# Patient Record
Sex: Female | Born: 1960 | Race: Black or African American | Hispanic: No | Marital: Single
Health system: Midwestern US, Academic
[De-identification: ages and names within clinical notes are randomized; demographics above are authoritative.]

---

## 2018-08-27 ENCOUNTER — Encounter: Admit: 2018-08-27 | Discharge: 2018-08-28

## 2018-08-28 ENCOUNTER — Inpatient Hospital Stay: Admit: 2018-08-28 | Discharge: 2018-08-28

## 2018-08-28 ENCOUNTER — Encounter: Admit: 2018-08-28 | Discharge: 2018-08-28

## 2018-08-28 DIAGNOSIS — E119 Type 2 diabetes mellitus without complications: Principal | ICD-10-CM

## 2018-08-28 DIAGNOSIS — K56609 Unspecified intestinal obstruction, unspecified as to partial versus complete obstruction: ICD-10-CM

## 2018-08-28 DIAGNOSIS — N189 Chronic kidney disease, unspecified: Secondary | ICD-10-CM

## 2018-08-28 DIAGNOSIS — I10 Essential (primary) hypertension: ICD-10-CM

## 2018-08-28 DIAGNOSIS — E785 Hyperlipidemia, unspecified: ICD-10-CM

## 2018-08-28 DIAGNOSIS — J449 Chronic obstructive pulmonary disease, unspecified: ICD-10-CM

## 2018-08-28 LAB — COMPREHENSIVE METABOLIC PANEL
Lab: 0.8 mg/dL (ref 0.3–1.2)
Lab: 12 mg/dL (ref 7–25)
Lab: 143 MMOL/L (ref 137–147)
Lab: 149 mg/dL — ABNORMAL HIGH (ref 70–100)
Lab: 2 mg/dL — ABNORMAL HIGH (ref 0.4–1.00)
Lab: 24 U/L (ref 7–40)
Lab: 3.2 MMOL/L — ABNORMAL LOW (ref 3.5–5.1)
Lab: 42 U/L (ref 25–110)
Lab: 6 g/dL (ref 6.0–8.0)
Lab: 8.2 mg/dL — ABNORMAL LOW (ref 8.5–10.6)

## 2018-08-28 LAB — LACTIC ACID(LACTATE): Lab: 1 MMOL/L — ABNORMAL LOW (ref >60)

## 2018-08-28 LAB — CBC
Lab: 12 10*3/uL — ABNORMAL HIGH (ref 4.5–11.0)
Lab: 13 g/dL (ref 12.0–15.0)
Lab: 39 % (ref >60)
Lab: 4.5 M/UL — ABNORMAL HIGH (ref 4.0–5.0)
Lab: 88 FL (ref >60)

## 2018-08-28 LAB — PREALBUMIN: Lab: 14 mg/dL — ABNORMAL LOW (ref 17–34)

## 2018-08-28 LAB — POC GLUCOSE: Lab: 99 mg/dL (ref 70–100)

## 2018-08-28 MED ORDER — FLU VACC QS2019-20 6MOS UP(PF) 60 MCG (15 MCG X 4)/0.5 ML IM SYRG
.5 mL | Freq: Once | INTRAMUSCULAR | 0 refills | Status: CP
Start: 2018-08-28 — End: ?
  Administered 2018-08-28: 16:00:00 0.5 mL via INTRAMUSCULAR

## 2018-08-28 MED ORDER — SUCCINYLCHOLINE CHLORIDE 20 MG/ML IJ SOLN
INTRAVENOUS | 0 refills | Status: DC
Start: 2018-08-28 — End: 2018-08-29
  Administered 2018-08-28: 21:00:00 200 mg via INTRAVENOUS

## 2018-08-28 MED ORDER — FENTANYL PCA 550 MCG/55 ML SYR (STD CONC)(ADULT)(PREMADE)
INTRAVENOUS | 0 refills | Status: DC
Start: 2018-08-28 — End: 2018-08-29
  Administered 2018-08-29: 02:00:00 55.000 mL via INTRAVENOUS

## 2018-08-28 MED ORDER — ROCURONIUM 10 MG/ML IV SOLN
INTRAVENOUS | 0 refills | Status: DC
Start: 2018-08-28 — End: 2018-08-29
  Administered 2018-08-28: 21:00:00 50 mg via INTRAVENOUS
  Administered 2018-08-28 – 2018-08-29 (×2): 10 mg via INTRAVENOUS

## 2018-08-28 MED ORDER — METOCLOPRAMIDE HCL 5 MG/ML IJ SOLN
10 mg | Freq: Once | INTRAVENOUS | 0 refills | Status: DC | PRN
Start: 2018-08-28 — End: 2018-08-29

## 2018-08-28 MED ORDER — SODIUM CHLORIDE 0.9 % IV SOLP
0 refills | Status: DC
Start: 2018-08-28 — End: 2018-08-29
  Administered 2018-08-28 – 2018-08-29 (×2): via INTRAVENOUS

## 2018-08-28 MED ORDER — ONDANSETRON HCL (PF) 4 MG/2 ML IJ SOLN
4-8 mg | INTRAVENOUS | 0 refills | Status: DC | PRN
Start: 2018-08-28 — End: 2018-09-07
  Administered 2018-08-29 – 2018-09-06 (×2): 4 mg via INTRAVENOUS

## 2018-08-28 MED ORDER — FENTANYL CITRATE (PF) 50 MCG/ML IJ SOLN
INTRAVENOUS | 0 refills | Status: CP
Start: 2018-08-28 — End: ?
  Administered 2018-08-28: 19:00:00 50 ug via INTRAVENOUS
  Administered 2018-08-29: 01:00:00 25 ug via INTRAVENOUS

## 2018-08-28 MED ORDER — HYDRALAZINE 20 MG/ML IJ SOLN
10 mg | Freq: Once | INTRAVENOUS | 0 refills | Status: CP
Start: 2018-08-28 — End: ?
  Administered 2018-08-29: 05:00:00 10 mg via INTRAVENOUS

## 2018-08-28 MED ORDER — DEXTRAN 70-HYPROMELLOSE (PF) 0.1-0.3 % OP DPET
0 refills | Status: DC
Start: 2018-08-28 — End: 2018-08-29
  Administered 2018-08-28: 21:00:00 2 [drp] via OPHTHALMIC

## 2018-08-28 MED ORDER — ACETAMINOPHEN 500 MG PO TAB
1000 mg | Freq: Once | ORAL | 0 refills | Status: CP
Start: 2018-08-28 — End: ?
  Administered 2018-08-28: 19:00:00 1000 mg via ORAL

## 2018-08-28 MED ORDER — IOHEXOL 350 MG IODINE/ML IV SOLN
100 mL | Freq: Once | INTRAVENOUS | 0 refills | Status: CP
Start: 2018-08-28 — End: ?
  Administered 2018-08-28: 12:00:00 100 mL via INTRAVENOUS

## 2018-08-28 MED ORDER — LACTATED RINGERS IV SOLP
1000 mL | INTRAVENOUS | 0 refills | Status: CP
Start: 2018-08-28 — End: ?
  Administered 2018-08-29: 02:00:00 1000 mL via INTRAVENOUS

## 2018-08-28 MED ORDER — DEXAMETHASONE SODIUM PHOSPHATE 4 MG/ML IJ SOLN
INTRAVENOUS | 0 refills | Status: DC
Start: 2018-08-28 — End: 2018-08-29
  Administered 2018-08-28: 21:00:00 4 mg via INTRAVENOUS

## 2018-08-28 MED ORDER — NALOXONE 0.4 MG/ML IJ SOLN
.08 mg | INTRAVENOUS | 0 refills | Status: DC | PRN
Start: 2018-08-28 — End: 2018-09-07

## 2018-08-28 MED ORDER — OXYCODONE 5 MG PO TAB
5-10 mg | Freq: Once | ORAL | 0 refills | Status: DC | PRN
Start: 2018-08-28 — End: 2018-08-29

## 2018-08-28 MED ORDER — HALOPERIDOL LACTATE 5 MG/ML IJ SOLN
1 mg | Freq: Once | INTRAVENOUS | 0 refills | Status: CP | PRN
Start: 2018-08-28 — End: ?
  Administered 2018-08-29: 02:00:00 1 mg via INTRAVENOUS

## 2018-08-28 MED ORDER — ONDANSETRON HCL (PF) 4 MG/2 ML IJ SOLN
INTRAVENOUS | 0 refills | Status: DC
Start: 2018-08-28 — End: 2018-08-29
  Administered 2018-08-29: 01:00:00 4 mg via INTRAVENOUS

## 2018-08-28 MED ORDER — LIDOCAINE (PF) 200 MG/10 ML (2 %) IJ SYRG
0 refills | Status: DC
Start: 2018-08-28 — End: 2018-08-29
  Administered 2018-08-28: 21:00:00 40 mg via INTRAVENOUS

## 2018-08-28 MED ORDER — BUPIVACAINE 0.25 % (2.5 MG/ML) IJ SOLN
0 refills | Status: CP
Start: 2018-08-28 — End: ?
  Administered 2018-08-28: 19:00:00 40 mL

## 2018-08-28 MED ORDER — ONDANSETRON HCL 4 MG PO TAB
4 mg | ORAL | 0 refills | Status: DC | PRN
Start: 2018-08-28 — End: 2018-08-29

## 2018-08-28 MED ORDER — ELECTROLYTE-A IV SOLP
0 refills | Status: DC
Start: 2018-08-28 — End: 2018-08-29
  Administered 2018-08-28: 21:00:00 via INTRAVENOUS

## 2018-08-28 MED ORDER — MEPERIDINE (PF) 25 MG/ML IJ SYRG
12.5 mg | INTRAVENOUS | 0 refills | Status: DC | PRN
Start: 2018-08-28 — End: 2018-08-29

## 2018-08-28 MED ORDER — FENTANYL CITRATE (PF) 50 MCG/ML IJ SOLN
0 refills | Status: DC
Start: 2018-08-28 — End: 2018-08-29
  Administered 2018-08-28: 21:00:00 100 ug via INTRAVENOUS
  Administered 2018-08-28 – 2018-08-29 (×2): 50 ug via INTRAVENOUS
  Administered 2018-08-29 (×2): 25 ug via INTRAVENOUS

## 2018-08-28 MED ORDER — FENTANYL CITRATE (PF) 50 MCG/ML IJ SOLN
50 ug | INTRAVENOUS | 0 refills | Status: DC | PRN
Start: 2018-08-28 — End: 2018-08-29
  Administered 2018-08-29 (×2): 50 ug via INTRAVENOUS

## 2018-08-28 MED ORDER — FENTANYL CITRATE (PF) 50 MCG/ML IJ SOLN
25-50 ug | INTRAVENOUS | 0 refills | Status: DC | PRN
Start: 2018-08-28 — End: 2018-08-29
  Administered 2018-08-28 (×4): 50 ug via INTRAVENOUS

## 2018-08-28 MED ORDER — LACTATED RINGERS IV SOLP
1000 mL | INTRAVENOUS | 0 refills | Status: CP
Start: 2018-08-28 — End: ?
  Administered 2018-08-28: 11:00:00 1000 mL via INTRAVENOUS

## 2018-08-28 MED ORDER — PROMETHAZINE 25 MG/ML IJ SOLN
6.25 mg | INTRAVENOUS | 0 refills | Status: DC | PRN
Start: 2018-08-28 — End: 2018-08-29

## 2018-08-28 MED ORDER — ENOXAPARIN 40 MG/0.4 ML SC SYRG
40 mg | Freq: Two times a day (BID) | SUBCUTANEOUS | 0 refills | Status: DC
Start: 2018-08-28 — End: 2018-09-05
  Administered 2018-08-29 – 2018-09-05 (×15): 40 mg via SUBCUTANEOUS

## 2018-08-28 MED ORDER — KETAMINE 10 MG/ML IJ SOLN (INFUSION)(AM)(OR)
0 refills | Status: DC
Start: 2018-08-28 — End: 2018-08-29
  Administered 2018-08-28: 21:00:00 200 ug/kg/h via INTRAVENOUS

## 2018-08-28 MED ORDER — FENTANYL CITRATE (PF) 50 MCG/ML IJ SOLN
25-50 ug | Freq: Once | INTRAVENOUS | 0 refills | Status: DC
Start: 2018-08-28 — End: 2018-08-29

## 2018-08-28 MED ORDER — SUGAMMADEX 100 MG/ML IV SOLN
INTRAVENOUS | 0 refills | Status: DC
Start: 2018-08-28 — End: 2018-08-29
  Administered 2018-08-29: 01:00:00 345 mg via INTRAVENOUS

## 2018-08-28 MED ORDER — ONDANSETRON HCL (PF) 4 MG/2 ML IJ SOLN
4 mg | INTRAVENOUS | 0 refills | Status: DC | PRN
Start: 2018-08-28 — End: 2018-08-29
  Administered 2018-08-28: 16:00:00 4 mg via INTRAVENOUS

## 2018-08-28 MED ORDER — CEFAZOLIN IVPB
3 g | INTRAVENOUS | 0 refills | Status: CP
Start: 2018-08-28 — End: ?
  Administered 2018-08-29 (×6): 3 g via INTRAVENOUS

## 2018-08-28 MED ORDER — FENTANYL CITRATE (PF) 50 MCG/ML IJ SOLN
25 ug | INTRAVENOUS | 0 refills | Status: DC | PRN
Start: 2018-08-28 — End: 2018-08-29

## 2018-08-28 MED ORDER — LACTATED RINGERS IV SOLP
INTRAVENOUS | 0 refills | Status: AC
Start: 2018-08-28 — End: ?
  Administered 2018-08-28 – 2018-08-30 (×6): 1000.000 mL via INTRAVENOUS

## 2018-08-28 MED ORDER — ALBUTEROL SULFATE 90 MCG/ACTUATION IN HFAA
2 | RESPIRATORY_TRACT | 0 refills | Status: DC | PRN
Start: 2018-08-28 — End: 2018-09-07
  Administered 2018-09-06: 18:00:00 2 via RESPIRATORY_TRACT

## 2018-08-28 MED ORDER — SODIUM CHLORIDE 0.9 % IJ SOLN
50 mL | Freq: Once | INTRAVENOUS | 0 refills | Status: CP
Start: 2018-08-28 — End: ?
  Administered 2018-08-28: 12:00:00 50 mL via INTRAVENOUS

## 2018-08-28 MED ORDER — CEFAZOLIN 1 GRAM IJ SOLR
0 refills | Status: DC
Start: 2018-08-28 — End: 2018-08-29
  Administered 2018-08-28: 21:00:00 3 g via INTRAVENOUS

## 2018-08-28 MED ORDER — BUPIVACAINE LIPOSOMAL/BUPIVACAINE HCL INJECTION
Freq: Once | 0 refills | Status: CP
Start: 2018-08-28 — End: ?
  Administered 2018-08-28 (×2): 20 mL

## 2018-08-28 MED ORDER — PROPOFOL INJ 10 MG/ML IV VIAL
0 refills | Status: DC
Start: 2018-08-28 — End: 2018-08-29
  Administered 2018-08-28: 21:00:00 200 mg via INTRAVENOUS

## 2018-08-28 MED ORDER — MIDAZOLAM 1 MG/ML IJ SOLN
INTRAVENOUS | 0 refills | Status: DC
Start: 2018-08-28 — End: 2018-08-29
  Administered 2018-08-28: 21:00:00 2 mg via INTRAVENOUS

## 2018-08-28 MED ADMIN — SODIUM CHLORIDE 0.9 % IV SOLP [27838]: INTRAVENOUS | @ 19:00:00 | Stop: 2018-08-28 | NDC 00338004904

## 2018-08-29 LAB — MAGNESIUM: Lab: 1.5 mg/dL — ABNORMAL LOW (ref >60)

## 2018-08-29 LAB — BASIC METABOLIC PANEL: Lab: 144 MMOL/L — ABNORMAL HIGH (ref >60)

## 2018-08-29 LAB — PHOSPHORUS: Lab: 4.1 mg/dL — ABNORMAL HIGH (ref 2.0–4.5)

## 2018-08-29 LAB — CBC: Lab: 12 10*3/uL — ABNORMAL HIGH (ref >60)

## 2018-08-29 LAB — POC GLUCOSE
Lab: 154 mg/dL — ABNORMAL HIGH (ref 70–100)
Lab: 132 mg/dL — ABNORMAL HIGH (ref 70–100)

## 2018-08-29 MED ORDER — INSULIN ASPART 100 UNIT/ML SC FLEXPEN
0-12 [IU] | Freq: Before meals | SUBCUTANEOUS | 0 refills | Status: DC
Start: 2018-08-29 — End: 2018-09-07

## 2018-08-29 MED ORDER — CYCLOBENZAPRINE 10 MG PO TAB
10 mg | Freq: Three times a day (TID) | ORAL | 0 refills | Status: DC
Start: 2018-08-29 — End: 2018-09-07
  Administered 2018-08-29 – 2018-09-06 (×21): 10 mg via ORAL

## 2018-08-29 MED ORDER — DIPHENHYDRAMINE HCL 50 MG/ML IJ SOLN
25 mg | INTRAVENOUS | 0 refills | Status: DC | PRN
Start: 2018-08-29 — End: 2018-09-04
  Administered 2018-08-29 – 2018-09-01 (×6): 25 mg via INTRAVENOUS

## 2018-08-29 MED ORDER — POTASSIUM CHLORIDE IN WATER 10 MEQ/50 ML IV PGBK
10 meq | INTRAVENOUS | 0 refills | Status: CP
Start: 2018-08-29 — End: ?
  Administered 2018-08-29 (×3): 10 meq via INTRAVENOUS

## 2018-08-29 MED ORDER — HYDROMORPHONE (PF) 2 MG/ML IJ SYRG
1-2 mg | INTRAVENOUS | 0 refills | Status: DC | PRN
Start: 2018-08-29 — End: 2018-09-04
  Administered 2018-08-29 – 2018-08-30 (×6): 2 mg via INTRAVENOUS
  Administered 2018-08-31: 18:00:00 1 mg via INTRAVENOUS
  Administered 2018-08-31: 08:00:00 2 mg via INTRAVENOUS
  Administered 2018-08-31: 02:00:00 1 mg via INTRAVENOUS
  Administered 2018-09-01 – 2018-09-04 (×5): 2 mg via INTRAVENOUS

## 2018-08-29 MED ORDER — MAGNESIUM SULFATE IN D5W 1 GRAM/100 ML IV PGBK
1 g | INTRAVENOUS | 0 refills | Status: CP
Start: 2018-08-29 — End: ?
  Administered 2018-08-29: 15:00:00 1 g via INTRAVENOUS

## 2018-08-29 MED ORDER — ACETAMINOPHEN 500 MG PO TAB
1000 mg | ORAL | 0 refills | Status: DC
Start: 2018-08-29 — End: 2018-09-07
  Administered 2018-08-29 – 2018-09-06 (×14): 1000 mg via ORAL

## 2018-08-29 MED ORDER — DOCUSATE SODIUM 100 MG PO CAP
100 mg | Freq: Two times a day (BID) | ORAL | 0 refills | Status: DC
Start: 2018-08-29 — End: 2018-09-07
  Administered 2018-08-31 – 2018-09-06 (×11): 100 mg via ORAL

## 2018-08-29 MED ORDER — MORPHINE PCA 50 MG/50 ML SYR (STD CONC)(ADULT)
INTRAVENOUS | 0 refills | Status: DC
Start: 2018-08-29 — End: 2018-09-02
  Administered 2018-08-29 – 2018-09-01 (×4): 50.000 mL via INTRAVENOUS

## 2018-08-29 MED ORDER — LABETALOL 5 MG/ML IV SYRG
10 mg | Freq: Once | INTRAVENOUS | 0 refills | Status: CP
Start: 2018-08-29 — End: ?
  Administered 2018-08-29: 09:00:00 10 mg via INTRAVENOUS

## 2018-08-29 MED ORDER — MORPHINE 4 MG/ML IV CRTG
2 mg | Freq: Once | INTRAVENOUS | 0 refills | Status: CP
Start: 2018-08-29 — End: ?
  Administered 2018-08-29: 14:00:00 2 mg via INTRAVENOUS

## 2018-08-29 MED ADMIN — SODIUM CHLORIDE 0.9 % IV SOLP [27838]: 250 mL | INTRAVENOUS | @ 05:00:00 | Stop: 2018-08-29 | NDC 00338004902

## 2018-08-30 ENCOUNTER — Encounter: Admit: 2018-08-30 | Discharge: 2018-08-30

## 2018-08-30 DIAGNOSIS — I10 Essential (primary) hypertension: ICD-10-CM

## 2018-08-30 DIAGNOSIS — J449 Chronic obstructive pulmonary disease, unspecified: ICD-10-CM

## 2018-08-30 DIAGNOSIS — E785 Hyperlipidemia, unspecified: ICD-10-CM

## 2018-08-30 DIAGNOSIS — E119 Type 2 diabetes mellitus without complications: Principal | ICD-10-CM

## 2018-08-30 LAB — MAGNESIUM: Lab: 2 mg/dL — ABNORMAL LOW (ref 1.6–2.6)

## 2018-08-30 LAB — POC GLUCOSE
Lab: 111 mg/dL — ABNORMAL HIGH (ref 70–100)
Lab: 115 mg/dL — ABNORMAL HIGH (ref 70–100)
Lab: 116 mg/dL — ABNORMAL HIGH (ref 70–100)
Lab: 103 mg/dL — ABNORMAL HIGH (ref 70–100)
Lab: 103 mg/dL — ABNORMAL HIGH (ref 70–100)

## 2018-08-30 LAB — CBC: Lab: 10 K/UL — ABNORMAL LOW (ref 4.5–11.0)

## 2018-08-30 LAB — PHOSPHORUS: Lab: 3.3 mg/dL — ABNORMAL HIGH (ref >60)

## 2018-08-30 LAB — BASIC METABOLIC PANEL: Lab: 143 MMOL/L — ABNORMAL LOW (ref 137–147)

## 2018-08-30 MED ORDER — POTASSIUM CHLORIDE IN WATER 10 MEQ/50 ML IV PGBK
10 meq | INTRAVENOUS | 0 refills | Status: CP
Start: 2018-08-30 — End: ?
  Administered 2018-08-30 (×3): 10 meq via INTRAVENOUS

## 2018-08-30 MED ORDER — LACTATED RINGERS IV SOLP
INTRAVENOUS | 0 refills | Status: AC
Start: 2018-08-30 — End: ?
  Administered 2018-08-31 – 2018-09-02 (×7): 1000.000 mL via INTRAVENOUS

## 2018-08-30 MED ORDER — AMLODIPINE 5 MG PO TAB
5 mg | Freq: Every day | ORAL | 0 refills | Status: DC
Start: 2018-08-30 — End: 2018-08-30

## 2018-08-30 MED ORDER — HYDRALAZINE 20 MG/ML IJ SOLN
10 mg | INTRAVENOUS | 0 refills | Status: DC | PRN
Start: 2018-08-30 — End: 2018-09-07
  Administered 2018-08-31 – 2018-09-02 (×2): 10 mg via INTRAVENOUS

## 2018-08-30 MED ORDER — BUSPIRONE 5 MG PO TAB
7.5 mg | Freq: Two times a day (BID) | ORAL | 0 refills | Status: DC
Start: 2018-08-30 — End: 2018-08-30

## 2018-08-30 MED ADMIN — LACTATED RINGERS IV SOLP [4318]: 1000 mL | @ 19:00:00 | Stop: 2018-08-30 | NDC 00338011704

## 2018-08-31 LAB — POC GLUCOSE
Lab: 120 mg/dL — ABNORMAL HIGH (ref 70–100)
Lab: 94 mg/dL (ref 70–100)
Lab: 95 mg/dL (ref 70–100)
Lab: 96 mg/dL (ref 70–100)

## 2018-08-31 LAB — MAGNESIUM: Lab: 1.8 mg/dL — ABNORMAL LOW (ref 1.6–2.6)

## 2018-08-31 LAB — BASIC METABOLIC PANEL: Lab: 145 MMOL/L — ABNORMAL LOW (ref 137–147)

## 2018-08-31 LAB — CBC
Lab: 3.1 M/UL — ABNORMAL LOW (ref 4.0–5.0)
Lab: 7.1 K/UL — ABNORMAL HIGH (ref >60)

## 2018-08-31 LAB — PHOSPHORUS: Lab: 2.7 mg/dL — ABNORMAL LOW (ref >60)

## 2018-08-31 MED ORDER — GABAPENTIN 250 MG/5 ML PO SOLN
100 mg | ORAL | 0 refills | Status: DC
Start: 2018-08-31 — End: 2018-09-01
  Administered 2018-09-01: 02:00:00 100 mg via ORAL

## 2018-08-31 MED ORDER — SENNOSIDES-DOCUSATE SODIUM 8.6-50 MG PO TAB
2 | Freq: Two times a day (BID) | ORAL | 0 refills | Status: DC
Start: 2018-08-31 — End: 2018-09-07
  Administered 2018-09-02 – 2018-09-06 (×11): 2 via ORAL

## 2018-08-31 MED ORDER — MAGNESIUM HYDROXIDE 2,400 MG/10 ML PO SUSP
30 mL | Freq: Once | ORAL | 0 refills | Status: CP
Start: 2018-08-31 — End: ?
  Administered 2018-08-31: 30 mL via ORAL

## 2018-09-01 LAB — POC GLUCOSE
Lab: 108 mg/dL — ABNORMAL HIGH (ref 70–100)
Lab: 130 mg/dL — ABNORMAL HIGH (ref 70–100)
Lab: 133 mg/dL — ABNORMAL HIGH (ref 70–100)
Lab: 141 mg/dL — ABNORMAL HIGH (ref 70–100)

## 2018-09-01 LAB — PHOSPHORUS: Lab: 2.8 mg/dL — ABNORMAL HIGH (ref >60)

## 2018-09-01 LAB — MAGNESIUM: Lab: 1.7 mg/dL — ABNORMAL LOW (ref >60)

## 2018-09-01 LAB — BASIC METABOLIC PANEL: Lab: 142 MMOL/L — ABNORMAL LOW (ref 137–147)

## 2018-09-01 LAB — CBC: Lab: 6 10*3/uL — ABNORMAL LOW (ref 4.5–11.0)

## 2018-09-01 MED ORDER — AMLODIPINE 5 MG PO TAB
5 mg | Freq: Every day | ORAL | 0 refills | Status: DC
Start: 2018-09-01 — End: 2018-09-07
  Administered 2018-09-01 – 2018-09-06 (×5): 5 mg via ORAL

## 2018-09-01 MED ORDER — ATORVASTATIN 40 MG PO TAB
40 mg | Freq: Every day | ORAL | 0 refills | Status: DC
Start: 2018-09-01 — End: 2018-09-07
  Administered 2018-09-01 – 2018-09-06 (×6): 40 mg via ORAL

## 2018-09-01 MED ORDER — ALBUTEROL SULFATE 90 MCG/ACTUATION IN HFAA
1-2 | RESPIRATORY_TRACT | 0 refills | Status: DC | PRN
Start: 2018-09-01 — End: 2018-09-01

## 2018-09-01 MED ORDER — MAGNESIUM OXIDE 400 MG (241.3 MG MAGNESIUM) PO TAB
800 mg | Freq: Two times a day (BID) | ORAL | 0 refills | Status: CP
Start: 2018-09-01 — End: ?
  Administered 2018-09-01 – 2018-09-02 (×2): 800 mg via ORAL

## 2018-09-01 MED ORDER — POTASSIUM CHL SR TAB-MG/AL HYD 30ML
Freq: Once | ORAL | 0 refills | Status: AC
Start: 2018-09-01 — End: ?

## 2018-09-01 MED ORDER — PANTOPRAZOLE 40 MG PO TBEC
40 mg | Freq: Every day | ORAL | 0 refills | Status: DC
Start: 2018-09-01 — End: 2018-09-07
  Administered 2018-09-01 – 2018-09-06 (×6): 40 mg via ORAL

## 2018-09-01 MED ORDER — BUSPIRONE 5 MG PO TAB
7.5 mg | Freq: Two times a day (BID) | ORAL | 0 refills | Status: DC
Start: 2018-09-01 — End: 2018-09-07
  Administered 2018-09-01 – 2018-09-06 (×11): 7.5 mg via ORAL

## 2018-09-01 MED ORDER — ASPIRIN 81 MG PO TBEC
81 mg | Freq: Every day | ORAL | 0 refills | Status: DC
Start: 2018-09-01 — End: 2018-09-07
  Administered 2018-09-01 – 2018-09-06 (×6): 81 mg via ORAL

## 2018-09-01 MED ORDER — GABAPENTIN 100 MG PO CAP
100 mg | ORAL | 0 refills | Status: DC
Start: 2018-09-01 — End: 2018-09-07
  Administered 2018-09-01 – 2018-09-06 (×16): 100 mg via ORAL

## 2018-09-02 LAB — POC GLUCOSE
Lab: 139 mg/dL — ABNORMAL HIGH (ref 70–100)
Lab: 142 mg/dL — ABNORMAL HIGH (ref 70–100)
Lab: 127 mg/dL — ABNORMAL HIGH (ref 70–100)
Lab: 138 mg/dL — ABNORMAL HIGH (ref 70–100)

## 2018-09-02 LAB — CBC
Lab: 2.9 M/UL — ABNORMAL LOW (ref >60)
Lab: 5.4 K/UL — ABNORMAL LOW (ref 4.5–11.0)

## 2018-09-02 LAB — MAGNESIUM: Lab: 1.7 mg/dL — ABNORMAL LOW (ref 1.6–2.6)

## 2018-09-02 LAB — PHOSPHORUS: Lab: 2.2 mg/dL — ABNORMAL LOW (ref >60)

## 2018-09-02 LAB — BASIC METABOLIC PANEL: Lab: 141 MMOL/L — ABNORMAL LOW (ref >60)

## 2018-09-02 MED ORDER — OXYCODONE 5 MG PO TAB
5-15 mg | ORAL | 0 refills | Status: DC | PRN
Start: 2018-09-02 — End: 2018-09-04
  Administered 2018-09-02: 13:00:00 10 mg via ORAL
  Administered 2018-09-02 – 2018-09-04 (×9): 15 mg via ORAL

## 2018-09-02 MED ORDER — POTASSIUM CHL SR TAB-MG/AL HYD 30ML
Freq: Once | ORAL | 0 refills | Status: CP
Start: 2018-09-02 — End: ?
  Administered 2018-09-02 (×2): 30.000 mL via ORAL

## 2018-09-02 MED ADMIN — LACTATED RINGERS IV SOLP [4318]: 1000 mL | INTRAVENOUS | @ 11:00:00 | Stop: 2018-09-02 | NDC 00338011704

## 2018-09-03 LAB — BASIC METABOLIC PANEL
Lab: 1.3 mg/dL — ABNORMAL HIGH (ref 0.4–1.00)
Lab: 108 MMOL/L — ABNORMAL LOW (ref 98–110)
Lab: 141 MMOL/L — ABNORMAL LOW (ref 137–147)
Lab: 161 mg/dL — ABNORMAL HIGH (ref 70–100)
Lab: 27 MMOL/L (ref 21–30)
Lab: 3.5 MMOL/L — ABNORMAL LOW (ref 3.5–5.1)
Lab: 39 mL/min — ABNORMAL LOW (ref >60)
Lab: 48 mL/min — ABNORMAL LOW (ref >60)
Lab: 6 pg (ref 3–12)
Lab: 7.8 mg/dL — ABNORMAL LOW (ref 8.5–10.6)
Lab: 9 mg/dL (ref 7–25)

## 2018-09-03 LAB — POC GLUCOSE
Lab: 131 mg/dL — ABNORMAL HIGH (ref 70–100)
Lab: 146 mg/dL — ABNORMAL HIGH (ref 70–100)
Lab: 103 mg/dL — ABNORMAL HIGH (ref 70–100)
Lab: 169 mg/dL — ABNORMAL HIGH (ref 70–100)

## 2018-09-03 LAB — PHOSPHORUS: Lab: 2.3 mg/dL (ref 2.0–4.5)

## 2018-09-03 LAB — URINALYSIS MICROSCOPIC REFLEX TO CULTURE

## 2018-09-03 LAB — URINALYSIS DIPSTICK REFLEX TO CULTURE
Lab: NEGATIVE
Lab: NEGATIVE
Lab: NEGATIVE
Lab: NEGATIVE
Lab: NEGATIVE

## 2018-09-03 LAB — CBC: Lab: 5.5 10*3/uL (ref 4.5–11.0)

## 2018-09-03 LAB — MAGNESIUM: Lab: 1.6 mg/dL (ref 1.6–2.6)

## 2018-09-03 MED ORDER — POTASSIUM PHOSPHATE, MONOBASIC 500 MG PO TBSO
2 | Freq: Once | ORAL | 0 refills | Status: CP
Start: 2018-09-03 — End: ?
  Administered 2018-09-03: 21:00:00 2 via ORAL

## 2018-09-03 MED ORDER — MAGNESIUM SULFATE IN D5W 1 GRAM/100 ML IV PGBK
1 g | INTRAVENOUS | 0 refills | Status: CP
Start: 2018-09-03 — End: ?
  Administered 2018-09-03 (×2): 1 g via INTRAVENOUS

## 2018-09-04 ENCOUNTER — Encounter: Admit: 2018-09-04 | Discharge: 2018-09-04

## 2018-09-04 DIAGNOSIS — K56609 Unspecified intestinal obstruction, unspecified as to partial versus complete obstruction: ICD-10-CM

## 2018-09-04 LAB — POC GLUCOSE
Lab: 163 mg/dL — ABNORMAL HIGH (ref 70–100)
Lab: 131 mg/dL — ABNORMAL HIGH (ref 70–100)
Lab: 126 mg/dL — ABNORMAL HIGH (ref 70–100)
Lab: 113 mg/dL — ABNORMAL HIGH (ref 70–100)

## 2018-09-04 LAB — CBC: Lab: 5.7 K/UL — ABNORMAL HIGH (ref >60)

## 2018-09-04 LAB — MAGNESIUM: Lab: 1.8 mg/dL — ABNORMAL HIGH (ref 1.6–2.6)

## 2018-09-04 LAB — PHOSPHORUS: Lab: 3.2 mg/dL — ABNORMAL LOW (ref 2.0–4.5)

## 2018-09-04 LAB — BASIC METABOLIC PANEL: Lab: 142 MMOL/L — ABNORMAL LOW (ref >60)

## 2018-09-04 MED ORDER — LACTATED RINGERS IV SOLP
1000 mL | INTRAVENOUS | 0 refills | Status: CP
Start: 2018-09-04 — End: ?
  Administered 2018-09-04: 16:00:00 1000 mL via INTRAVENOUS

## 2018-09-04 MED ORDER — HYDROMORPHONE (PF) 2 MG/ML IJ SYRG
.5-1 mg | INTRAVENOUS | 0 refills | Status: DC | PRN
Start: 2018-09-04 — End: 2018-09-05

## 2018-09-04 MED ORDER — MIDAZOLAM 1 MG/ML IJ SOLN
1-2 mg | Freq: Once | INTRAVENOUS | 0 refills | Status: AC
Start: 2018-09-04 — End: ?

## 2018-09-04 MED ORDER — FENTANYL CITRATE (PF) 50 MCG/ML IJ SOLN
25-50 ug | Freq: Once | INTRAVENOUS | 0 refills | Status: CP
Start: 2018-09-04 — End: ?
  Administered 2018-09-04: 22:00:00 50 ug via INTRAVENOUS

## 2018-09-04 MED ORDER — OXYCODONE 5 MG PO TAB
5-15 mg | ORAL | 0 refills | Status: DC | PRN
Start: 2018-09-04 — End: 2018-09-07
  Administered 2018-09-04 – 2018-09-06 (×15): 15 mg via ORAL

## 2018-09-04 MED ORDER — SODIUM CHLORIDE 0.9 % IJ SOLN
50 mL | Freq: Once | INTRAVENOUS | 0 refills | Status: CP
Start: 2018-09-04 — End: ?
  Administered 2018-09-04: 17:00:00 50 mL via INTRAVENOUS

## 2018-09-04 MED ORDER — DIPHENHYDRAMINE HCL 25 MG PO CAP
25 mg | ORAL | 0 refills | Status: DC | PRN
Start: 2018-09-04 — End: 2018-09-07
  Administered 2018-09-06: 02:00:00 25 mg via ORAL

## 2018-09-04 MED ORDER — FENTANYL CITRATE (PF) 50 MCG/ML IJ SOLN
0 refills | Status: CP
Start: 2018-09-04 — End: ?
  Administered 2018-09-04 (×2): 50 ug via INTRAVENOUS

## 2018-09-04 MED ORDER — LACTATED RINGERS IV SOLP
1000 mL | INTRAVENOUS | 0 refills | Status: AC
Start: 2018-09-04 — End: ?

## 2018-09-04 MED ORDER — IOHEXOL 350 MG IODINE/ML IV SOLN
80 mL | Freq: Once | INTRAVENOUS | 0 refills | Status: CP
Start: 2018-09-04 — End: ?
  Administered 2018-09-04: 17:00:00 80 mL via INTRAVENOUS

## 2018-09-05 LAB — POC GLUCOSE
Lab: 143 mg/dL — ABNORMAL HIGH (ref 70–100)
Lab: 118 mg/dL — ABNORMAL HIGH (ref 70–100)
Lab: 135 mg/dL — ABNORMAL HIGH (ref 70–100)
Lab: 127 mg/dL — ABNORMAL HIGH (ref 70–100)

## 2018-09-05 LAB — GRAM STAIN

## 2018-09-05 LAB — CBC: Lab: 4.7 K/UL — ABNORMAL HIGH (ref >60)

## 2018-09-05 LAB — MAGNESIUM: Lab: 1.6 mg/dL — ABNORMAL LOW (ref >60)

## 2018-09-05 LAB — BASIC METABOLIC PANEL: Lab: 143 MMOL/L — ABNORMAL LOW (ref 137–147)

## 2018-09-05 LAB — PHOSPHORUS: Lab: 3.5 mg/dL — ABNORMAL LOW (ref >60)

## 2018-09-05 MED ORDER — SODIUM CHLORIDE 0.9 % FLUSH
10 mL | Freq: Two times a day (BID) | 0 refills | Status: DC
Start: 2018-09-05 — End: 2018-09-07
  Administered 2018-09-05 – 2018-09-06 (×3): 10 mL

## 2018-09-05 MED ORDER — ENOXAPARIN 60 MG/0.6 ML SC SYRG
60 mg | Freq: Two times a day (BID) | SUBCUTANEOUS | 0 refills | Status: DC
Start: 2018-09-05 — End: 2018-09-07
  Administered 2018-09-06 (×2): 60 mg via SUBCUTANEOUS

## 2018-09-06 ENCOUNTER — Ambulatory Visit: Admit: 2018-08-28 | Discharge: 2018-08-29

## 2018-09-06 ENCOUNTER — Inpatient Hospital Stay: Admit: 2018-08-28 | Discharge: 2018-08-28

## 2018-09-06 ENCOUNTER — Encounter: Admit: 2018-09-06 | Discharge: 2018-09-06

## 2018-09-06 ENCOUNTER — Inpatient Hospital Stay: Admit: 2018-09-04 | Discharge: 2018-09-04

## 2018-09-06 ENCOUNTER — Inpatient Hospital Stay
Admit: 2018-08-28 | Discharge: 2018-09-06 | Disposition: A | Discharge status: Skilled Nursing Facility | Payer: MEDICARE | Source: Other Acute Inpatient Hospital | Attending: Surgical Critical Care | Admitting: Surgical Critical Care

## 2018-09-06 DIAGNOSIS — D62 Acute posthemorrhagic anemia: ICD-10-CM

## 2018-09-06 DIAGNOSIS — E1165 Type 2 diabetes mellitus with hyperglycemia: ICD-10-CM

## 2018-09-06 DIAGNOSIS — E1140 Type 2 diabetes mellitus with diabetic neuropathy, unspecified: ICD-10-CM

## 2018-09-06 DIAGNOSIS — I129 Hypertensive chronic kidney disease with stage 1 through stage 4 chronic kidney disease, or unspecified chronic kidney disease: ICD-10-CM

## 2018-09-06 DIAGNOSIS — E1122 Type 2 diabetes mellitus with diabetic chronic kidney disease: ICD-10-CM

## 2018-09-06 DIAGNOSIS — Z9049 Acquired absence of other specified parts of digestive tract: ICD-10-CM

## 2018-09-06 DIAGNOSIS — R188 Other ascites: ICD-10-CM

## 2018-09-06 DIAGNOSIS — J449 Chronic obstructive pulmonary disease, unspecified: ICD-10-CM

## 2018-09-06 DIAGNOSIS — F17210 Nicotine dependence, cigarettes, uncomplicated: ICD-10-CM

## 2018-09-06 DIAGNOSIS — Z23 Encounter for immunization: ICD-10-CM

## 2018-09-06 DIAGNOSIS — N189 Chronic kidney disease, unspecified: ICD-10-CM

## 2018-09-06 DIAGNOSIS — N179 Acute kidney failure, unspecified: ICD-10-CM

## 2018-09-06 DIAGNOSIS — K433 Parastomal hernia with obstruction, without gangrene: Principal | ICD-10-CM

## 2018-09-06 DIAGNOSIS — E785 Hyperlipidemia, unspecified: ICD-10-CM

## 2018-09-06 DIAGNOSIS — Z794 Long term (current) use of insulin: ICD-10-CM

## 2018-09-06 DIAGNOSIS — Z6841 Body Mass Index (BMI) 40.0 and over, adult: ICD-10-CM

## 2018-09-06 DIAGNOSIS — E876 Hypokalemia: ICD-10-CM

## 2018-09-06 DIAGNOSIS — K660 Peritoneal adhesions (postprocedural) (postinfection): ICD-10-CM

## 2018-09-06 LAB — POC GLUCOSE
Lab: 138 mg/dL — ABNORMAL HIGH (ref 70–100)
Lab: 110 mg/dL — ABNORMAL HIGH (ref 70–100)
Lab: 120 mg/dL — ABNORMAL HIGH (ref 70–100)

## 2018-09-06 LAB — CBC: Lab: 5.8 10*3/uL — ABNORMAL LOW (ref 4.5–11.0)

## 2018-09-06 LAB — BASIC METABOLIC PANEL: Lab: 143 MMOL/L — ABNORMAL LOW (ref >60)

## 2018-09-06 LAB — PHOSPHORUS: Lab: 3.9 mg/dL — ABNORMAL LOW (ref 2.0–4.5)

## 2018-09-06 LAB — MAGNESIUM: Lab: 1.6 mg/dL — ABNORMAL HIGH (ref 1.6–2.6)

## 2018-09-06 MED ORDER — SENNOSIDES-DOCUSATE SODIUM 8.6-50 MG PO TAB
2 | ORAL_TABLET | Freq: Two times a day (BID) | ORAL | 1 refills | Status: AC
Start: 2018-09-06 — End: ?

## 2018-09-06 MED ORDER — GABAPENTIN 100 MG PO CAP
100 mg | ORAL_CAPSULE | Freq: Three times a day (TID) | ORAL | 0 refills | Status: AC
Start: 2018-09-06 — End: 2018-11-16
  Filled 2018-09-17: qty 180, 60d supply

## 2018-09-06 MED ORDER — OXYCODONE 5 MG PO TAB
5-15 mg | ORAL_TABLET | ORAL | 0 refills | 6.00000 days | Status: AC | PRN
Start: 2018-09-06 — End: 2018-09-12

## 2018-09-06 MED ORDER — DOCUSATE SODIUM 100 MG PO CAP
100 mg | ORAL_CAPSULE | Freq: Two times a day (BID) | ORAL | 1 refills | Status: SS | PRN
Start: 2018-09-06 — End: 2019-04-18

## 2018-09-06 MED ORDER — ACETAMINOPHEN 500 MG PO TAB
500-1000 mg | ORAL_TABLET | ORAL | 0 refills | Status: SS | PRN
Start: 2018-09-06 — End: 2018-09-11

## 2018-09-06 MED ORDER — CYCLOBENZAPRINE 10 MG PO TAB
10 mg | ORAL_TABLET | Freq: Three times a day (TID) | ORAL | 1 refills | 21.00000 days | Status: AC | PRN
Start: 2018-09-06 — End: ?

## 2018-09-07 ENCOUNTER — Encounter: Admit: 2018-09-07 | Discharge: 2018-09-07

## 2018-09-07 DIAGNOSIS — E785 Hyperlipidemia, unspecified: ICD-10-CM

## 2018-09-07 DIAGNOSIS — E119 Type 2 diabetes mellitus without complications: Principal | ICD-10-CM

## 2018-09-07 DIAGNOSIS — I10 Essential (primary) hypertension: ICD-10-CM

## 2018-09-07 DIAGNOSIS — J449 Chronic obstructive pulmonary disease, unspecified: ICD-10-CM

## 2018-09-07 DIAGNOSIS — E860 Dehydration: Secondary | ICD-10-CM

## 2018-09-08 LAB — COMPREHENSIVE METABOLIC PANEL
Lab: 0.6 mg/dL (ref 0.3–1.2)
Lab: 1.5 mg/dL — ABNORMAL HIGH (ref 0.4–1.00)
Lab: 100 mg/dL (ref 70–100)
Lab: 109 MMOL/L — ABNORMAL LOW (ref 98–110)
Lab: 143 MMOL/L — ABNORMAL LOW (ref 137–147)
Lab: 17 mg/dL (ref 7–25)
Lab: 2.6 g/dL — ABNORMAL LOW (ref 3.5–5.0)
Lab: 24 U/L (ref 7–40)
Lab: 4.8 MMOL/L — ABNORMAL LOW (ref 3.5–5.1)
Lab: 5.3 g/dL — ABNORMAL LOW (ref 6.0–8.0)
Lab: 7.9 mg/dL — ABNORMAL LOW (ref 8.5–10.6)

## 2018-09-08 LAB — POC LACTATE: Lab: 0.8 MMOL/L (ref 0.5–2.0)

## 2018-09-08 LAB — URINALYSIS DIPSTICK
Lab: NEGATIVE
Lab: NEGATIVE
Lab: NEGATIVE
Lab: NEGATIVE
Lab: NEGATIVE
Lab: NEGATIVE
Lab: NEGATIVE

## 2018-09-08 LAB — POC GLUCOSE
Lab: 92 mg/dL (ref 70–100)
Lab: 98 mg/dL — AB (ref 70–100)

## 2018-09-08 LAB — LACTIC ACID(LACTATE): Lab: 0.6 MMOL/L (ref 0.5–2.0)

## 2018-09-08 LAB — URINALYSIS, MICROSCOPIC

## 2018-09-08 LAB — CBC AND DIFF: Lab: 6.6 10*3/uL (ref 4.5–11.0)

## 2018-09-08 LAB — HEMOGLOBIN A1C: Lab: 5 % (ref 4.0–6.0)

## 2018-09-08 LAB — POC CREATININE, RAD: Lab: 1.6 mg/dL — ABNORMAL HIGH (ref 0.4–1.00)

## 2018-09-08 MED ORDER — ENOXAPARIN 40 MG/0.4 ML SC SYRG
40 mg | Freq: Every day | SUBCUTANEOUS | 0 refills | Status: DC
Start: 2018-09-08 — End: 2018-09-08

## 2018-09-08 MED ORDER — INSULIN ASPART 100 UNIT/ML SC FLEXPEN
0-6 [IU] | Freq: Before meals | SUBCUTANEOUS | 0 refills | Status: DC
Start: 2018-09-08 — End: 2018-09-12

## 2018-09-08 MED ORDER — PANTOPRAZOLE 40 MG PO TBEC
40 mg | Freq: Every day | ORAL | 0 refills | Status: DC
Start: 2018-09-08 — End: 2018-09-12
  Administered 2018-09-08 – 2018-09-11 (×4): 40 mg via ORAL

## 2018-09-08 MED ORDER — ENOXAPARIN 60 MG/0.6 ML SC SYRG
60 mg | Freq: Two times a day (BID) | SUBCUTANEOUS | 0 refills | Status: DC
Start: 2018-09-08 — End: 2018-09-12
  Administered 2018-09-08 – 2018-09-11 (×7): 60 mg via SUBCUTANEOUS

## 2018-09-08 MED ORDER — FENTANYL CITRATE (PF) 50 MCG/ML IJ SOLN
25 ug | Freq: Once | INTRAVENOUS | 0 refills | Status: CP
Start: 2018-09-08 — End: ?
  Administered 2018-09-08: 17:00:00 25 ug via INTRAVENOUS

## 2018-09-08 MED ORDER — SENNOSIDES-DOCUSATE SODIUM 8.6-50 MG PO TAB
2 | Freq: Two times a day (BID) | ORAL | 0 refills | Status: DC
Start: 2018-09-08 — End: 2018-09-10
  Administered 2018-09-09 – 2018-09-10 (×4): 2 via ORAL

## 2018-09-08 MED ORDER — CEFTRIAXONE INJ 1GM IVP
1 g | INTRAVENOUS | 0 refills | Status: DC
Start: 2018-09-08 — End: 2018-09-08
  Administered 2018-09-08: 13:00:00 1 g via INTRAVENOUS

## 2018-09-08 MED ORDER — ASPIRIN 81 MG PO TBEC
81 mg | Freq: Every day | ORAL | 0 refills | Status: DC
Start: 2018-09-08 — End: 2018-09-12
  Administered 2018-09-08 – 2018-09-11 (×4): 81 mg via ORAL

## 2018-09-08 MED ORDER — OXYCODONE 5 MG PO TAB
5 mg | ORAL | 0 refills | Status: DC | PRN
Start: 2018-09-08 — End: 2018-09-12
  Administered 2018-09-08 – 2018-09-11 (×17): 5 mg via ORAL

## 2018-09-08 MED ORDER — ATORVASTATIN 40 MG PO TAB
40 mg | Freq: Every day | ORAL | 0 refills | Status: DC
Start: 2018-09-08 — End: 2018-09-12
  Administered 2018-09-08 – 2018-09-11 (×4): 40 mg via ORAL

## 2018-09-08 MED ORDER — LACTATED RINGERS IV SOLP
1000 mL | INTRAVENOUS | 0 refills | Status: DC
Start: 2018-09-08 — End: 2018-09-09
  Administered 2018-09-08 – 2018-09-09 (×3): 1000 mL via INTRAVENOUS

## 2018-09-08 MED ORDER — OXYCODONE 5 MG PO TAB
5 mg | Freq: Once | ORAL | 0 refills | Status: CP
Start: 2018-09-08 — End: ?
  Administered 2018-09-08: 08:00:00 5 mg via ORAL

## 2018-09-08 MED ORDER — AMLODIPINE 5 MG PO TAB
5 mg | Freq: Every day | ORAL | 0 refills | Status: DC
Start: 2018-09-08 — End: 2018-09-12
  Administered 2018-09-08 – 2018-09-11 (×4): 5 mg via ORAL

## 2018-09-08 MED ORDER — BUSPIRONE 5 MG PO TAB
7.5 mg | Freq: Two times a day (BID) | ORAL | 0 refills | Status: DC
Start: 2018-09-08 — End: 2018-09-12
  Administered 2018-09-08 – 2018-09-11 (×7): 7.5 mg via ORAL

## 2018-09-08 MED ORDER — PIPERACILLIN/TAZOBACTAM 3.375 G/NS IVPB (MB+)
3.375 g | INTRAVENOUS | 0 refills | Status: DC
Start: 2018-09-08 — End: 2018-09-08

## 2018-09-08 MED ORDER — LACTATED RINGERS IV SOLP
1000 mL | INTRAVENOUS | 0 refills | Status: DC
Start: 2018-09-08 — End: 2018-09-08
  Administered 2018-09-08: 14:00:00 1000 mL via INTRAVENOUS

## 2018-09-08 MED ORDER — METRONIDAZOLE IN NACL (ISO-OS) 500 MG/100 ML IV PGBK
500 mg | INTRAVENOUS | 0 refills | Status: DC
Start: 2018-09-08 — End: 2018-09-08
  Administered 2018-09-08: 12:00:00 500 mg via INTRAVENOUS

## 2018-09-08 MED ORDER — ALBUTEROL SULFATE 90 MCG/ACTUATION IN HFAA
1-2 | RESPIRATORY_TRACT | 0 refills | Status: DC | PRN
Start: 2018-09-08 — End: 2018-09-12

## 2018-09-08 MED ORDER — GABAPENTIN 100 MG PO CAP
100 mg | Freq: Three times a day (TID) | ORAL | 0 refills | Status: DC
Start: 2018-09-08 — End: 2018-09-12
  Administered 2018-09-08 – 2018-09-11 (×11): 100 mg via ORAL

## 2018-09-08 MED ORDER — ONDANSETRON HCL (PF) 4 MG/2 ML IJ SOLN
4 mg | INTRAVENOUS | 0 refills | Status: DC | PRN
Start: 2018-09-08 — End: 2018-09-09
  Administered 2018-09-08 – 2018-09-09 (×3): 4 mg via INTRAVENOUS

## 2018-09-08 MED ORDER — CYCLOBENZAPRINE 10 MG PO TAB
10 mg | Freq: Three times a day (TID) | ORAL | 0 refills | Status: DC | PRN
Start: 2018-09-08 — End: 2018-09-12
  Administered 2018-09-08 – 2018-09-11 (×11): 10 mg via ORAL

## 2018-09-09 LAB — CULTURE-WOUND/TISSUE/FLUID(AEROBIC ONLY)W/SENSITIVITY

## 2018-09-09 LAB — CBC AND DIFF: Lab: 4.3 K/UL — ABNORMAL LOW (ref 4.5–11.0)

## 2018-09-09 LAB — POC GLUCOSE
Lab: 96 mg/dL (ref 70–100)
Lab: 113 mg/dL — ABNORMAL HIGH (ref 70–100)
Lab: 125 mg/dL — ABNORMAL HIGH (ref 70–100)
Lab: 136 mg/dL — ABNORMAL HIGH (ref 70–100)

## 2018-09-09 LAB — C DIFFICILE BY PCR: Lab: NEGATIVE

## 2018-09-09 LAB — COMPREHENSIVE METABOLIC PANEL: Lab: 137 MMOL/L — ABNORMAL LOW (ref >60)

## 2018-09-09 MED ORDER — ONDANSETRON HCL (PF) 4 MG/2 ML IJ SOLN
4 mg | INTRAVENOUS | 0 refills | Status: DC | PRN
Start: 2018-09-09 — End: 2018-09-12
  Administered 2018-09-10 (×2): 4 mg via INTRAVENOUS

## 2018-09-09 MED ORDER — OXYCODONE 5 MG PO TAB
5 mg | Freq: Once | ORAL | 0 refills | Status: CP
Start: 2018-09-09 — End: ?
  Administered 2018-09-09: 18:00:00 5 mg via ORAL

## 2018-09-10 LAB — POC GLUCOSE
Lab: 121 mg/dL — ABNORMAL HIGH (ref 70–100)
Lab: 130 mg/dL — ABNORMAL HIGH (ref 70–100)
Lab: 136 mg/dL — ABNORMAL HIGH (ref 70–100)
Lab: 162 mg/dL — ABNORMAL HIGH (ref 70–100)

## 2018-09-10 LAB — CBC AND DIFF: Lab: 4.2 K/UL — ABNORMAL LOW (ref 4.5–11.0)

## 2018-09-10 LAB — COMPREHENSIVE METABOLIC PANEL: Lab: 139 MMOL/L — ABNORMAL LOW (ref 137–147)

## 2018-09-10 MED ORDER — POLYETHYLENE GLYCOL 3350 17 GRAM PO PWPK
2 | Freq: Two times a day (BID) | ORAL | 0 refills | Status: DC
Start: 2018-09-10 — End: 2018-09-12
  Administered 2018-09-11 (×2): 34 g via ORAL

## 2018-09-10 MED ORDER — ACETAMINOPHEN 325 MG PO TAB
650 mg | ORAL | 0 refills | Status: DC | PRN
Start: 2018-09-10 — End: 2018-09-12
  Administered 2018-09-10 – 2018-09-11 (×7): 650 mg via ORAL

## 2018-09-10 MED ORDER — SENNOSIDES 8.6 MG PO TAB
2 | Freq: Two times a day (BID) | ORAL | 0 refills | Status: DC
Start: 2018-09-10 — End: 2018-09-10

## 2018-09-10 MED ORDER — LACTATED RINGERS IV SOLP
500 mL | INTRAVENOUS | 0 refills | Status: CP
Start: 2018-09-10 — End: ?
  Administered 2018-09-10: 500 mL via INTRAVENOUS

## 2018-09-10 MED ORDER — HYOSCYAMINE SULFATE 0.125 MG PO TBDI
.125 mg | Freq: Two times a day (BID) | SUBLINGUAL | 0 refills | Status: DC | PRN
Start: 2018-09-10 — End: 2018-09-12
  Administered 2018-09-10: 18:00:00 0.125 mg via SUBLINGUAL

## 2018-09-11 LAB — POC GLUCOSE
Lab: 111 mg/dL — ABNORMAL HIGH (ref 70–100)
Lab: 121 mg/dL — ABNORMAL HIGH (ref 70–100)
Lab: 125 mg/dL — ABNORMAL HIGH (ref 70–100)
Lab: 140 mg/dL — ABNORMAL HIGH (ref 70–100)
Lab: 140 mg/dL — ABNORMAL HIGH (ref 70–100)
Lab: 141 mg/dL — ABNORMAL HIGH (ref 70–100)

## 2018-09-11 LAB — CBC AND DIFF: Lab: 3.7 K/UL — ABNORMAL LOW (ref >60)

## 2018-09-11 LAB — COMPREHENSIVE METABOLIC PANEL: Lab: 141 MMOL/L — ABNORMAL LOW (ref >60)

## 2018-09-11 MED ORDER — ACETAMINOPHEN 500 MG PO TAB
1000 mg | ORAL_TABLET | Freq: Three times a day (TID) | ORAL | 0 refills | Status: AC
Start: 2018-09-11 — End: 2018-10-02

## 2018-09-11 MED ORDER — HYOSCYAMINE SULFATE 0.125 MG PO TBDI
.125 mg | ORAL_TABLET | Freq: Two times a day (BID) | SUBLINGUAL | 0 refills | Status: AC | PRN
Start: 2018-09-11 — End: ?

## 2018-09-11 MED ORDER — POLYETHYLENE GLYCOL 3350 17 GRAM PO PWPK
34 g | Freq: Every day | ORAL | 0 refills | 18.00000 days | Status: AC | PRN
Start: 2018-09-11 — End: ?

## 2018-09-11 MED ORDER — OXYCODONE 5 MG PO TAB
5 mg | ORAL | 0 refills | 6.00000 days | Status: AC | PRN
Start: 2018-09-11 — End: 2018-10-02

## 2018-09-12 ENCOUNTER — Emergency Department: Admit: 2018-09-08 | Discharge: 2018-09-08

## 2018-09-12 ENCOUNTER — Inpatient Hospital Stay: Admit: 2018-09-08 | Discharge: 2018-09-12 | Disposition: A | Discharge status: Rehabilitation Facility | Payer: MEDICARE

## 2018-09-12 ENCOUNTER — Inpatient Hospital Stay: Admit: 2018-09-10 | Discharge: 2018-09-10

## 2018-09-12 DIAGNOSIS — Z6841 Body Mass Index (BMI) 40.0 and over, adult: ICD-10-CM

## 2018-09-12 DIAGNOSIS — Z90710 Acquired absence of both cervix and uterus: ICD-10-CM

## 2018-09-12 DIAGNOSIS — I509 Heart failure, unspecified: ICD-10-CM

## 2018-09-12 DIAGNOSIS — E1122 Type 2 diabetes mellitus with diabetic chronic kidney disease: ICD-10-CM

## 2018-09-12 DIAGNOSIS — A084 Viral intestinal infection, unspecified: Principal | ICD-10-CM

## 2018-09-12 DIAGNOSIS — Z933 Colostomy status: ICD-10-CM

## 2018-09-12 DIAGNOSIS — J449 Chronic obstructive pulmonary disease, unspecified: ICD-10-CM

## 2018-09-12 DIAGNOSIS — K435 Parastomal hernia without obstruction or  gangrene: ICD-10-CM

## 2018-09-12 DIAGNOSIS — E785 Hyperlipidemia, unspecified: ICD-10-CM

## 2018-09-12 DIAGNOSIS — I130 Hypertensive heart and chronic kidney disease with heart failure and stage 1 through stage 4 chronic kidney disease, or unspecified chronic kidney disease: ICD-10-CM

## 2018-09-12 DIAGNOSIS — K219 Gastro-esophageal reflux disease without esophagitis: ICD-10-CM

## 2018-09-12 DIAGNOSIS — N183 Chronic kidney disease, stage 3 (moderate): ICD-10-CM

## 2018-09-12 DIAGNOSIS — E1140 Type 2 diabetes mellitus with diabetic neuropathy, unspecified: ICD-10-CM

## 2018-09-12 DIAGNOSIS — E860 Dehydration: ICD-10-CM

## 2018-09-12 DIAGNOSIS — Z9049 Acquired absence of other specified parts of digestive tract: ICD-10-CM

## 2018-09-12 DIAGNOSIS — R911 Solitary pulmonary nodule: ICD-10-CM

## 2018-09-15 ENCOUNTER — Encounter: Admit: 2018-09-15 | Discharge: 2018-09-15

## 2018-09-15 DIAGNOSIS — T8149XA Infection following a procedure, other surgical site, initial encounter: ICD-10-CM

## 2018-09-15 MED ORDER — PANTOPRAZOLE 40 MG PO TBEC
40 mg | Freq: Every day | ORAL | 0 refills | Status: DC
Start: 2018-09-15 — End: 2018-09-18
  Administered 2018-09-16 – 2018-09-17 (×2): 40 mg via ORAL

## 2018-09-15 MED ORDER — FENTANYL CITRATE (PF) 50 MCG/ML IJ SOLN
25-50 ug | INTRAVENOUS | 0 refills | Status: DC | PRN
Start: 2018-09-15 — End: 2018-09-18

## 2018-09-15 MED ORDER — CETIRIZINE 10 MG PO TAB
10 mg | Freq: Every evening | ORAL | 0 refills | Status: DC
Start: 2018-09-15 — End: 2018-09-18
  Administered 2018-09-16 – 2018-09-17 (×2): 10 mg via ORAL

## 2018-09-15 MED ORDER — ALBUTEROL SULFATE 90 MCG/ACTUATION IN HFAA
1-2 | RESPIRATORY_TRACT | 0 refills | Status: DC | PRN
Start: 2018-09-15 — End: 2018-09-18

## 2018-09-15 MED ORDER — FENTANYL CITRATE (PF) 50 MCG/ML IJ SOLN
50 ug | Freq: Once | INTRAVENOUS | 0 refills | Status: CP
Start: 2018-09-15 — End: ?
  Administered 2018-09-16: 03:00:00 50 ug via INTRAVENOUS

## 2018-09-15 MED ORDER — ASPIRIN 81 MG PO TBEC
81 mg | Freq: Every day | ORAL | 0 refills | Status: DC
Start: 2018-09-15 — End: 2018-09-18
  Administered 2018-09-16 – 2018-09-17 (×2): 81 mg via ORAL

## 2018-09-15 MED ORDER — CYCLOBENZAPRINE 10 MG PO TAB
10 mg | Freq: Three times a day (TID) | ORAL | 0 refills | Status: DC | PRN
Start: 2018-09-15 — End: 2018-09-18
  Administered 2018-09-16 – 2018-09-17 (×3): 10 mg via ORAL

## 2018-09-15 MED ORDER — PIPERACILLIN/TAZOBACTAM 3.375 G/NS IVPB (MB+)
3.375 g | INTRAVENOUS | 0 refills | Status: DC
Start: 2018-09-15 — End: 2018-09-18
  Administered 2018-09-16 – 2018-09-17 (×14): 3.375 g via INTRAVENOUS

## 2018-09-15 MED ORDER — OXYCODONE 5 MG PO TAB
5-10 mg | ORAL | 0 refills | Status: DC | PRN
Start: 2018-09-15 — End: 2018-09-18
  Administered 2018-09-16 – 2018-09-17 (×11): 10 mg via ORAL

## 2018-09-15 MED ORDER — FENTANYL CITRATE (PF) 50 MCG/ML IJ SOLN
25 ug | Freq: Once | INTRAVENOUS | 0 refills | Status: CP
Start: 2018-09-15 — End: ?
  Administered 2018-09-16: 04:00:00 25 ug via INTRAVENOUS

## 2018-09-15 MED ORDER — BUSPIRONE 5 MG PO TAB
7.5 mg | Freq: Two times a day (BID) | ORAL | 0 refills | Status: DC
Start: 2018-09-15 — End: 2018-09-18
  Administered 2018-09-16 – 2018-09-17 (×4): 7.5 mg via ORAL

## 2018-09-15 MED ORDER — AMLODIPINE 5 MG PO TAB
5 mg | Freq: Every day | ORAL | 0 refills | Status: DC
Start: 2018-09-15 — End: 2018-09-18
  Administered 2018-09-16 – 2018-09-17 (×2): 5 mg via ORAL

## 2018-09-15 MED ORDER — ATORVASTATIN 40 MG PO TAB
40 mg | Freq: Every evening | ORAL | 0 refills | Status: DC
Start: 2018-09-15 — End: 2018-09-18
  Administered 2018-09-16 – 2018-09-17 (×2): 40 mg via ORAL

## 2018-09-15 MED ORDER — ENOXAPARIN 30 MG/0.3 ML SC SYRG
30 mg | Freq: Two times a day (BID) | SUBCUTANEOUS | 0 refills | Status: DC
Start: 2018-09-15 — End: 2018-09-16

## 2018-09-15 MED ORDER — GABAPENTIN 100 MG PO CAP
100 mg | Freq: Three times a day (TID) | ORAL | 0 refills | Status: DC
Start: 2018-09-15 — End: 2018-09-18
  Administered 2018-09-16 – 2018-09-17 (×6): 100 mg via ORAL

## 2018-09-15 MED ORDER — ONDANSETRON HCL (PF) 4 MG/2 ML IJ SOLN
4-8 mg | INTRAVENOUS | 0 refills | Status: DC | PRN
Start: 2018-09-15 — End: 2018-09-18

## 2018-09-16 ENCOUNTER — Emergency Department: Admit: 2018-09-16 | Discharge: 2018-09-16 | Attending: Trauma Surgery

## 2018-09-16 LAB — POC GLUCOSE: Lab: 90 mg/dL (ref 70–100)

## 2018-09-16 LAB — COMPREHENSIVE METABOLIC PANEL
Lab: 0.4 mg/dL (ref 0.3–1.2)
Lab: 1.6 mg/dL — ABNORMAL HIGH (ref 0.4–1.00)
Lab: 106 MMOL/L — ABNORMAL LOW (ref 98–110)
Lab: 138 MMOL/L — ABNORMAL LOW (ref 137–147)
Lab: 16 U/L (ref 7–56)
Lab: 2.5 g/dL — ABNORMAL LOW (ref 3.5–5.0)
Lab: 24 MMOL/L (ref 21–30)
Lab: 24 U/L (ref 7–40)
Lab: 33 mL/min — ABNORMAL LOW (ref >60)
Lab: 4.2 MMOL/L — ABNORMAL LOW (ref 3.5–5.1)
Lab: 40 mL/min — ABNORMAL LOW (ref >60)
Lab: 5.3 g/dL — ABNORMAL LOW (ref 6.0–8.0)
Lab: 58 U/L — ABNORMAL LOW (ref 25–110)
Lab: 8.1 mg/dL — ABNORMAL LOW (ref 8.5–10.6)
Lab: 93 mg/dL (ref 70–100)

## 2018-09-16 LAB — PHOSPHORUS: Lab: 4 mg/dL — ABNORMAL HIGH (ref >60)

## 2018-09-16 LAB — MAGNESIUM: Lab: 1.4 mg/dL — ABNORMAL LOW (ref >60)

## 2018-09-16 LAB — CBC AND DIFF
Lab: 0 10*3/uL (ref 0–0.20)
Lab: 0.3 10*3/uL (ref 0–0.45)
Lab: 5.6 10*3/uL (ref 4.5–11.0)

## 2018-09-16 LAB — POC LACTATE: Lab: 1.3 MMOL/L (ref 0.5–2.0)

## 2018-09-16 LAB — CBC: Lab: 4.3 K/UL — ABNORMAL LOW (ref 4.5–11.0)

## 2018-09-16 LAB — BASIC METABOLIC PANEL: Lab: 137 MMOL/L — ABNORMAL LOW (ref 137–147)

## 2018-09-16 MED ORDER — ENOXAPARIN 30 MG/0.3 ML SC SYRG
30 mg | Freq: Once | SUBCUTANEOUS | 0 refills | Status: CP
Start: 2018-09-16 — End: ?
  Administered 2018-09-16: 07:00:00 30 mg via SUBCUTANEOUS

## 2018-09-16 MED ORDER — ENOXAPARIN 60 MG/0.6 ML SC SYRG
60 mg | Freq: Two times a day (BID) | SUBCUTANEOUS | 0 refills | Status: DC
Start: 2018-09-16 — End: 2018-09-18
  Administered 2018-09-16 – 2018-09-17 (×3): 60 mg via SUBCUTANEOUS

## 2018-09-16 MED ADMIN — ENOXAPARIN 30 MG/0.3 ML SC SYRG [85048]: 30 mg | SUBCUTANEOUS | @ 06:00:00 | Stop: 2018-09-16 | NDC 63323056883

## 2018-09-17 ENCOUNTER — Inpatient Hospital Stay
Admit: 2018-09-16 | Discharge: 2018-09-17 | Disposition: A | Discharge status: Skilled Nursing Facility | Payer: MEDICARE | Attending: Trauma Surgery | Admitting: Trauma Surgery

## 2018-09-17 ENCOUNTER — Encounter: Admit: 2018-09-17 | Discharge: 2018-09-17

## 2018-09-17 DIAGNOSIS — Z87891 Personal history of nicotine dependence: ICD-10-CM

## 2018-09-17 DIAGNOSIS — L0390 Cellulitis, unspecified: ICD-10-CM

## 2018-09-17 DIAGNOSIS — I130 Hypertensive heart and chronic kidney disease with heart failure and stage 1 through stage 4 chronic kidney disease, or unspecified chronic kidney disease: ICD-10-CM

## 2018-09-17 DIAGNOSIS — Z6841 Body Mass Index (BMI) 40.0 and over, adult: ICD-10-CM

## 2018-09-17 DIAGNOSIS — I509 Heart failure, unspecified: ICD-10-CM

## 2018-09-17 DIAGNOSIS — Z9049 Acquired absence of other specified parts of digestive tract: ICD-10-CM

## 2018-09-17 DIAGNOSIS — E1122 Type 2 diabetes mellitus with diabetic chronic kidney disease: ICD-10-CM

## 2018-09-17 DIAGNOSIS — Z90710 Acquired absence of both cervix and uterus: ICD-10-CM

## 2018-09-17 DIAGNOSIS — T8141XA Infection following a procedure, superficial incisional surgical site, initial encounter: Principal | ICD-10-CM

## 2018-09-17 DIAGNOSIS — N189 Chronic kidney disease, unspecified: ICD-10-CM

## 2018-09-17 DIAGNOSIS — Z933 Colostomy status: ICD-10-CM

## 2018-09-17 DIAGNOSIS — J449 Chronic obstructive pulmonary disease, unspecified: ICD-10-CM

## 2018-09-17 DIAGNOSIS — E785 Hyperlipidemia, unspecified: ICD-10-CM

## 2018-09-17 LAB — MRSA SCREEN

## 2018-09-17 LAB — MAGNESIUM: Lab: 1.5 mg/dL — ABNORMAL LOW (ref 1.6–2.6)

## 2018-09-17 LAB — PHOSPHORUS: Lab: 3.8 mg/dL (ref >60)

## 2018-09-17 LAB — VRE SCREEN

## 2018-09-17 LAB — BASIC METABOLIC PANEL: Lab: 139 MMOL/L — ABNORMAL LOW (ref >60)

## 2018-09-17 LAB — CBC: Lab: 4 K/UL — ABNORMAL LOW (ref 4.5–11.0)

## 2018-09-17 MED ORDER — MAGNESIUM SULFATE IN D5W 1 GRAM/100 ML IV PGBK
1 g | INTRAVENOUS | 0 refills | Status: CP
Start: 2018-09-17 — End: ?
  Administered 2018-09-17 (×2): 1 g via INTRAVENOUS

## 2018-09-17 MED ORDER — AMOXICILLIN-POT CLAVULANATE 500-125 MG PO TAB
1 | ORAL_TABLET | ORAL | 0 refills | 7.00000 days | Status: AC
Start: 2018-09-17 — End: 2018-10-02

## 2018-09-17 MED ORDER — AMOXICILLIN-POT CLAVULANATE 500-125 MG PO TAB
1 | ORAL_TABLET | ORAL | 0 refills | 7.00000 days | Status: AC
Start: 2018-09-17 — End: 2018-09-17
  Filled 2018-09-17: qty 12, 4d supply

## 2018-09-17 MED ADMIN — SODIUM CHLORIDE 0.9 % IV SOLP [27838]: 250 mL | INTRAVENOUS | @ 17:00:00 | Stop: 2018-09-17 | NDC 00338004902

## 2018-09-18 ENCOUNTER — Encounter: Admit: 2018-09-18 | Discharge: 2018-09-18

## 2018-09-27 ENCOUNTER — Encounter: Admit: 2018-09-27 | Discharge: 2018-09-27

## 2018-10-02 ENCOUNTER — Encounter: Admit: 2018-10-02 | Discharge: 2018-10-02

## 2018-10-02 ENCOUNTER — Inpatient Hospital Stay
Admit: 2018-10-02 | Discharge: 2018-10-05 | Disposition: A | Discharge status: Skilled Nursing Facility | Payer: MEDICARE | Source: Ambulatory Visit | Attending: Surgical Critical Care | Admitting: Surgical Critical Care

## 2018-10-02 DIAGNOSIS — E785 Hyperlipidemia, unspecified: ICD-10-CM

## 2018-10-02 DIAGNOSIS — I10 Essential (primary) hypertension: ICD-10-CM

## 2018-10-02 DIAGNOSIS — J449 Chronic obstructive pulmonary disease, unspecified: ICD-10-CM

## 2018-10-02 DIAGNOSIS — E119 Type 2 diabetes mellitus without complications: Principal | ICD-10-CM

## 2018-10-02 DIAGNOSIS — A498 Other bacterial infections of unspecified site: ICD-10-CM

## 2018-10-02 DIAGNOSIS — T8149XA Infection following a procedure, other surgical site, initial encounter: ICD-10-CM

## 2018-10-02 LAB — POC GLUCOSE: Lab: 91 mg/dL (ref 70–100)

## 2018-10-02 MED ORDER — SUCRALFATE 1 GRAM PO TAB
1 g | Freq: Four times a day (QID) | ORAL | 0 refills | Status: DC
Start: 2018-10-02 — End: 2018-10-05
  Administered 2018-10-02 – 2018-10-04 (×9): 1 g via ORAL

## 2018-10-02 MED ORDER — ONDANSETRON HCL 4 MG PO TAB
4 mg | ORAL | 0 refills | Status: DC | PRN
Start: 2018-10-02 — End: 2018-10-03

## 2018-10-02 MED ORDER — DOCUSATE SODIUM 100 MG PO CAP
100 mg | Freq: Two times a day (BID) | ORAL | 0 refills | Status: DC | PRN
Start: 2018-10-02 — End: 2018-10-05

## 2018-10-02 MED ORDER — ATORVASTATIN 40 MG PO TAB
40 mg | Freq: Every evening | ORAL | 0 refills | Status: DC
Start: 2018-10-02 — End: 2018-10-05
  Administered 2018-10-03 – 2018-10-04 (×2): 40 mg via ORAL

## 2018-10-02 MED ORDER — DEXTROSE 5%-0.45% SODIUM CHLORIDE IV SOLP
INTRAVENOUS | 0 refills | Status: DC
Start: 2018-10-02 — End: 2018-10-03
  Administered 2018-10-02 – 2018-10-03 (×2): 1000.000 mL via INTRAVENOUS

## 2018-10-02 MED ORDER — AMLODIPINE 5 MG PO TAB
5 mg | Freq: Every day | ORAL | 0 refills | Status: DC
Start: 2018-10-02 — End: 2018-10-05
  Administered 2018-10-02 – 2018-10-04 (×3): 5 mg via ORAL

## 2018-10-02 MED ORDER — ENOXAPARIN 60 MG/0.6 ML SC SYRG
60 mg | Freq: Two times a day (BID) | SUBCUTANEOUS | 0 refills | Status: DC
Start: 2018-10-02 — End: 2018-10-05
  Administered 2018-10-03 – 2018-10-04 (×4): 60 mg via SUBCUTANEOUS

## 2018-10-02 MED ORDER — POLYETHYLENE GLYCOL 3350 17 GRAM PO PWPK
34 g | Freq: Every day | ORAL | 0 refills | Status: DC | PRN
Start: 2018-10-02 — End: 2018-10-05

## 2018-10-02 MED ORDER — CYCLOBENZAPRINE 10 MG PO TAB
10 mg | Freq: Three times a day (TID) | ORAL | 0 refills | Status: DC | PRN
Start: 2018-10-02 — End: 2018-10-05
  Administered 2018-10-02 – 2018-10-04 (×4): 10 mg via ORAL

## 2018-10-02 MED ORDER — PANTOPRAZOLE 40 MG PO TBEC
40 mg | Freq: Every day | ORAL | 0 refills | Status: DC
Start: 2018-10-02 — End: 2018-10-05
  Administered 2018-10-02 – 2018-10-04 (×3): 40 mg via ORAL

## 2018-10-02 MED ORDER — ONDANSETRON HCL (PF) 4 MG/2 ML IJ SOLN
4-8 mg | INTRAVENOUS | 0 refills | Status: DC | PRN
Start: 2018-10-02 — End: 2018-10-05
  Administered 2018-10-03 (×2): 4 mg via INTRAVENOUS

## 2018-10-02 MED ORDER — INSULIN ASPART 100 UNIT/ML SC FLEXPEN
0-12 [IU] | Freq: Every day | SUBCUTANEOUS | 0 refills | Status: DC
Start: 2018-10-02 — End: 2018-10-05

## 2018-10-02 MED ORDER — SENNOSIDES-DOCUSATE SODIUM 8.6-50 MG PO TAB
2 | Freq: Two times a day (BID) | ORAL | 0 refills | Status: DC
Start: 2018-10-02 — End: 2018-10-05
  Administered 2018-10-03 – 2018-10-04 (×3): 2 via ORAL

## 2018-10-02 MED ORDER — BUSPIRONE 5 MG PO TAB
7.5 mg | Freq: Two times a day (BID) | ORAL | 0 refills | Status: DC
Start: 2018-10-02 — End: 2018-10-05
  Administered 2018-10-03 – 2018-10-04 (×4): 7.5 mg via ORAL

## 2018-10-02 MED ORDER — GABAPENTIN 100 MG PO CAP
100 mg | Freq: Three times a day (TID) | ORAL | 0 refills | Status: DC
Start: 2018-10-02 — End: 2018-10-05
  Administered 2018-10-02 – 2018-10-04 (×7): 100 mg via ORAL

## 2018-10-02 MED ORDER — ALBUTEROL SULFATE 90 MCG/ACTUATION IN HFAA
1-2 | RESPIRATORY_TRACT | 0 refills | Status: DC | PRN
Start: 2018-10-02 — End: 2018-10-05

## 2018-10-02 MED ORDER — OXYCODONE 5 MG PO TAB
5-10 mg | ORAL | 0 refills | Status: DC | PRN
Start: 2018-10-02 — End: 2018-10-05
  Administered 2018-10-02 – 2018-10-05 (×11): 10 mg via ORAL

## 2018-10-02 MED ORDER — CETIRIZINE 10 MG PO TAB
10 mg | Freq: Every evening | ORAL | 0 refills | Status: DC
Start: 2018-10-02 — End: 2018-10-05
  Administered 2018-10-03 – 2018-10-04 (×2): 10 mg via ORAL

## 2018-10-02 MED ORDER — DIATRIZOATE MEG-DIATRIZOAT SOD 66-10 % PO SOLN
30 mL | Freq: Once | ORAL | 0 refills | Status: CP
Start: 2018-10-02 — End: ?
  Administered 2018-10-03: 05:00:00 30 mL via ORAL

## 2018-10-02 MED ORDER — ASPIRIN 81 MG PO TBEC
81 mg | Freq: Every day | ORAL | 0 refills | Status: DC
Start: 2018-10-02 — End: 2018-10-05
  Administered 2018-10-03 – 2018-10-04 (×2): 81 mg via ORAL

## 2018-10-02 MED ORDER — ACETAMINOPHEN 325 MG PO TAB
650 mg | ORAL | 0 refills | Status: DC | PRN
Start: 2018-10-02 — End: 2018-10-05
  Administered 2018-10-02 – 2018-10-04 (×9): 650 mg via ORAL

## 2018-10-03 LAB — MAGNESIUM
Lab: 1.6 mg/dL — ABNORMAL LOW (ref 1.6–2.6)
Lab: 1.6 mg/dL — ABNORMAL HIGH (ref 1.6–2.6)

## 2018-10-03 LAB — BASIC METABOLIC PANEL
Lab: 1.4 mg/dL — ABNORMAL HIGH (ref 0.4–1.00)
Lab: 11 mg/dL (ref 7–25)
Lab: 139 MMOL/L — ABNORMAL LOW (ref 137–147)
Lab: 25 MMOL/L (ref 21–30)
Lab: 38 mL/min — ABNORMAL LOW (ref >60)
Lab: 45 mL/min — ABNORMAL LOW (ref >60)
Lab: 6 pg (ref 3–12)
Lab: 8.3 mg/dL — ABNORMAL LOW (ref 8.5–10.6)
Lab: 96 mg/dL (ref 70–100)
Lab: 139 MMOL/L — ABNORMAL HIGH (ref >60)

## 2018-10-03 LAB — GRAM STAIN

## 2018-10-03 LAB — POC GLUCOSE
Lab: 94 mg/dL (ref 70–100)
Lab: 91 mg/dL (ref 70–100)
Lab: 92 mg/dL (ref 70–100)
Lab: 126 mg/dL — ABNORMAL HIGH (ref 70–100)
Lab: 104 mg/dL — ABNORMAL HIGH (ref 70–100)

## 2018-10-03 LAB — PHOSPHORUS
Lab: 3.4 mg/dL — ABNORMAL LOW (ref 2.0–4.5)
Lab: 3.8 mg/dL — ABNORMAL LOW (ref 2.0–4.5)

## 2018-10-03 LAB — CBC
Lab: 5.2 K/UL (ref 4.5–11.0)
Lab: 4.5 K/UL — ABNORMAL HIGH (ref >60)

## 2018-10-03 MED ORDER — MAGNESIUM SULFATE IN WATER 4 GRAM/50 ML (8 %) IV PGBK
4 g | Freq: Once | INTRAVENOUS | 0 refills | Status: CP
Start: 2018-10-03 — End: ?
  Administered 2018-10-03: 13:00:00 4 g via INTRAVENOUS

## 2018-10-03 MED ORDER — PIPERACILLIN/TAZOBACTAM 4.5 G/NS IVPB (MB+)
4.5 g | INTRAVENOUS | 0 refills | Status: DC
Start: 2018-10-03 — End: 2018-10-04
  Administered 2018-10-03 – 2018-10-04 (×7): 4.5 g via INTRAVENOUS

## 2018-10-04 ENCOUNTER — Encounter: Admit: 2018-10-04 | Discharge: 2018-10-04

## 2018-10-04 LAB — PHOSPHORUS: Lab: 3.7 mg/dL — ABNORMAL LOW (ref 2.0–4.5)

## 2018-10-04 LAB — BASIC METABOLIC PANEL: Lab: 141 MMOL/L — ABNORMAL LOW (ref >60)

## 2018-10-04 LAB — MAGNESIUM: Lab: 2 mg/dL — ABNORMAL HIGH (ref >60)

## 2018-10-04 LAB — POC GLUCOSE
Lab: 115 mg/dL — ABNORMAL HIGH (ref 70–100)
Lab: 127 mg/dL — ABNORMAL HIGH (ref 70–100)
Lab: 138 mg/dL — ABNORMAL HIGH (ref 70–100)
Lab: 120 mg/dL — ABNORMAL HIGH (ref 70–100)
Lab: 119 mg/dL — ABNORMAL HIGH (ref 70–100)

## 2018-10-04 LAB — CBC: Lab: 4.2 10*3/uL — ABNORMAL LOW (ref >60)

## 2018-10-04 MED ORDER — LEVOFLOXACIN 750 MG PO TAB
750 mg | ORAL | 0 refills | Status: DC
Start: 2018-10-04 — End: 2018-10-05
  Administered 2018-10-04: 18:00:00 750 mg via ORAL

## 2018-10-04 MED ORDER — LEVOFLOXACIN 500 MG PO TAB
500 mg | ORAL_TABLET | Freq: Every day | ORAL | 0 refills | Status: SS
Start: 2018-10-04 — End: 2018-10-18

## 2018-10-04 MED ORDER — LEVOFLOXACIN 500 MG PO TAB
500 mg | ORAL | 0 refills | Status: DC
Start: 2018-10-04 — End: 2018-10-04

## 2018-10-05 ENCOUNTER — Inpatient Hospital Stay: Admit: 2018-10-02 | Discharge: 2018-10-02

## 2018-10-05 ENCOUNTER — Ambulatory Visit: Admit: 2018-10-02 | Discharge: 2018-10-02

## 2018-10-05 DIAGNOSIS — I509 Heart failure, unspecified: ICD-10-CM

## 2018-10-05 DIAGNOSIS — M549 Dorsalgia, unspecified: ICD-10-CM

## 2018-10-05 DIAGNOSIS — D638 Anemia in other chronic diseases classified elsewhere: ICD-10-CM

## 2018-10-05 DIAGNOSIS — I130 Hypertensive heart and chronic kidney disease with heart failure and stage 1 through stage 4 chronic kidney disease, or unspecified chronic kidney disease: ICD-10-CM

## 2018-10-05 DIAGNOSIS — Z90710 Acquired absence of both cervix and uterus: ICD-10-CM

## 2018-10-05 DIAGNOSIS — J449 Chronic obstructive pulmonary disease, unspecified: ICD-10-CM

## 2018-10-05 DIAGNOSIS — Z933 Colostomy status: ICD-10-CM

## 2018-10-05 DIAGNOSIS — E1122 Type 2 diabetes mellitus with diabetic chronic kidney disease: ICD-10-CM

## 2018-10-05 DIAGNOSIS — N183 Chronic kidney disease, stage 3 (moderate): ICD-10-CM

## 2018-10-05 DIAGNOSIS — Z87891 Personal history of nicotine dependence: ICD-10-CM

## 2018-10-05 DIAGNOSIS — G8929 Other chronic pain: ICD-10-CM

## 2018-10-05 DIAGNOSIS — Z6841 Body Mass Index (BMI) 40.0 and over, adult: ICD-10-CM

## 2018-10-05 DIAGNOSIS — B965 Pseudomonas (aeruginosa) (mallei) (pseudomallei) as the cause of diseases classified elsewhere: ICD-10-CM

## 2018-10-05 DIAGNOSIS — T8141XA Infection following a procedure, superficial incisional surgical site, initial encounter: Principal | ICD-10-CM

## 2018-10-05 DIAGNOSIS — Z9049 Acquired absence of other specified parts of digestive tract: ICD-10-CM

## 2018-10-07 LAB — CULTURE-WOUND/TISSUE/FLUID(AEROBIC ONLY)W/SENSITIVITY

## 2018-10-08 LAB — CULTURE-ANAEROBIC

## 2018-10-08 LAB — CULTURE-FUNGAL,OTHER

## 2018-10-11 LAB — CULTURE-FUNGAL,OTHER

## 2018-10-15 ENCOUNTER — Encounter: Admit: 2018-10-15 | Discharge: 2018-10-15

## 2018-10-15 ENCOUNTER — Emergency Department: Admit: 2018-10-15 | Discharge: 2018-10-15

## 2018-10-15 DIAGNOSIS — R109 Unspecified abdominal pain: ICD-10-CM

## 2018-10-15 DIAGNOSIS — I10 Essential (primary) hypertension: ICD-10-CM

## 2018-10-15 DIAGNOSIS — J449 Chronic obstructive pulmonary disease, unspecified: ICD-10-CM

## 2018-10-15 DIAGNOSIS — E119 Type 2 diabetes mellitus without complications: Principal | ICD-10-CM

## 2018-10-15 DIAGNOSIS — E785 Hyperlipidemia, unspecified: ICD-10-CM

## 2018-10-15 LAB — COMPREHENSIVE METABOLIC PANEL
Lab: 0.7 mg/dL (ref 0.3–1.2)
Lab: 138 MMOL/L — ABNORMAL LOW (ref 137–147)
Lab: 14 U/L (ref 7–40)
Lab: 2.9 g/dL — ABNORMAL LOW (ref 3.5–5.0)
Lab: 25 MMOL/L (ref 21–30)
Lab: 28 mL/min — ABNORMAL LOW (ref >60)
Lab: 34 mL/min — ABNORMAL LOW (ref >60)
Lab: 46 U/L — ABNORMAL LOW (ref 25–110)
Lab: 5.7 g/dL — ABNORMAL LOW (ref 6.0–8.0)
Lab: 6 K/UL (ref 3–12)
Lab: 7 U/L (ref 7–56)

## 2018-10-15 LAB — CBC AND DIFF
Lab: 0 10*3/uL (ref 0–0.20)
Lab: 0 10*3/uL (ref 0–0.45)
Lab: 4.1 10*3/uL — ABNORMAL LOW (ref 4.5–11.0)

## 2018-10-15 LAB — POC LACTATE: Lab: 1.3 MMOL/L (ref 0.5–2.0)

## 2018-10-15 LAB — PTT (APTT): Lab: 23 s — ABNORMAL LOW (ref 24.0–36.5)

## 2018-10-15 LAB — PROTIME INR (PT): Lab: 1.1 MMOL/L — ABNORMAL LOW (ref 0.8–1.2)

## 2018-10-15 LAB — POC CREATININE, RAD: Lab: 2.1 mg/dL — ABNORMAL HIGH (ref 0.4–1.00)

## 2018-10-15 LAB — BNP POC ER: Lab: 170 pg/mL — ABNORMAL HIGH (ref 0–100)

## 2018-10-15 LAB — POC TROPONIN: Lab: 0 ng/mL (ref 0.00–0.05)

## 2018-10-15 LAB — MAGNESIUM: Lab: 1.5 mg/dL — ABNORMAL LOW (ref 1.6–2.6)

## 2018-10-15 LAB — PHOSPHORUS: Lab: 4.3 mg/dL — ABNORMAL LOW (ref 2.0–4.5)

## 2018-10-15 LAB — LIPASE: Lab: 73 U/L (ref 11–82)

## 2018-10-15 LAB — D-DIMER: Lab: 143 ng{FEU}/mL — ABNORMAL HIGH (ref <500)

## 2018-10-15 MED ORDER — LACTATED RINGERS IV SOLP
500 mL | Freq: Once | INTRAVENOUS | 0 refills | Status: CP
Start: 2018-10-15 — End: ?
  Administered 2018-10-15: 18:00:00 500 mL via INTRAVENOUS

## 2018-10-15 MED ORDER — OXYCODONE 5 MG PO TAB
5 mg | Freq: Once | ORAL | 0 refills | Status: CP
Start: 2018-10-15 — End: ?
  Administered 2018-10-15: 20:00:00 5 mg via ORAL

## 2018-10-15 MED ORDER — MAGNESIUM OXIDE 400 MG (241.3 MG MAGNESIUM) PO TAB
800 mg | Freq: Once | ORAL | 0 refills | Status: CP
Start: 2018-10-15 — End: ?
  Administered 2018-10-15: 20:00:00 800 mg via ORAL

## 2018-10-15 MED ORDER — DICYCLOMINE 10 MG PO CAP
10 mg | Freq: Once | ORAL | 0 refills | Status: CP
Start: 2018-10-15 — End: ?
  Administered 2018-10-15: 18:00:00 10 mg via ORAL

## 2018-10-15 MED ORDER — IOHEXOL 350 MG IODINE/ML IV SOLN
70 mL | Freq: Once | INTRAVENOUS | 0 refills | Status: CP
Start: 2018-10-15 — End: ?
  Administered 2018-10-15: 17:00:00 70 mL via INTRAVENOUS

## 2018-10-15 MED ORDER — ALUM-MAG HYDROXIDE-SIMETH 200-200-20 MG/5 ML PO SUSP
15 mL | Freq: Once | ORAL | 0 refills | Status: CP
Start: 2018-10-15 — End: ?
  Administered 2018-10-15: 18:00:00 15 mL via ORAL

## 2018-10-15 MED ORDER — SODIUM CHLORIDE 0.9 % IJ SOLN
50 mL | Freq: Once | INTRAVENOUS | 0 refills | Status: CP
Start: 2018-10-15 — End: ?
  Administered 2018-10-15: 17:00:00 50 mL via INTRAVENOUS

## 2018-10-15 MED ORDER — ONDANSETRON HCL (PF) 4 MG/2 ML IJ SOLN
4 mg | Freq: Once | INTRAVENOUS | 0 refills | Status: CP | PRN
Start: 2018-10-15 — End: ?
  Administered 2018-10-15: 15:00:00 4 mg via INTRAVENOUS

## 2018-10-15 MED ORDER — FENTANYL CITRATE (PF) 50 MCG/ML IJ SOLN
50 ug | Freq: Once | INTRAVENOUS | 0 refills | Status: CP
Start: 2018-10-15 — End: ?

## 2018-10-16 ENCOUNTER — Encounter: Admit: 2018-10-16 | Discharge: 2018-10-16

## 2018-10-16 DIAGNOSIS — J449 Chronic obstructive pulmonary disease, unspecified: ICD-10-CM

## 2018-10-16 DIAGNOSIS — R5383 Other fatigue: ICD-10-CM

## 2018-10-16 DIAGNOSIS — R109 Unspecified abdominal pain: ICD-10-CM

## 2018-10-16 DIAGNOSIS — I10 Essential (primary) hypertension: ICD-10-CM

## 2018-10-16 DIAGNOSIS — E119 Type 2 diabetes mellitus without complications: Principal | ICD-10-CM

## 2018-10-16 DIAGNOSIS — R0600 Dyspnea, unspecified: ICD-10-CM

## 2018-10-16 DIAGNOSIS — E785 Hyperlipidemia, unspecified: ICD-10-CM

## 2018-10-16 LAB — POC GLUCOSE: Lab: 96 mg/dL (ref 70–100)

## 2018-10-16 MED ORDER — SODIUM CHLORIDE 0.9 % IV SOLP
500 mL | INTRAVENOUS | 0 refills | Status: CP
Start: 2018-10-16 — End: ?
  Administered 2018-10-17: 05:00:00 500 mL via INTRAVENOUS

## 2018-10-16 MED ORDER — GABAPENTIN 100 MG PO CAP
100 mg | Freq: Once | ORAL | 0 refills | Status: CP
Start: 2018-10-16 — End: ?
  Administered 2018-10-17: 05:00:00 100 mg via ORAL

## 2018-10-17 ENCOUNTER — Encounter: Admit: 2018-10-17 | Discharge: 2018-10-17

## 2018-10-17 DIAGNOSIS — E785 Hyperlipidemia, unspecified: Secondary | ICD-10-CM

## 2018-10-17 DIAGNOSIS — I10 Essential (primary) hypertension: Secondary | ICD-10-CM

## 2018-10-17 DIAGNOSIS — N141 Nephropathy induced by other drugs, medicaments and biological substances: Secondary | ICD-10-CM

## 2018-10-17 DIAGNOSIS — E119 Type 2 diabetes mellitus without complications: Secondary | ICD-10-CM

## 2018-10-17 DIAGNOSIS — J449 Chronic obstructive pulmonary disease, unspecified: Secondary | ICD-10-CM

## 2018-10-17 LAB — BASIC METABOLIC PANEL
Lab: 136 MMOL/L — ABNORMAL LOW (ref 137–147)
Lab: 109 MMOL/L — AB (ref 98–110)
Lab: 114 mg/dL — ABNORMAL HIGH (ref 70–100)
Lab: 138 MMOL/L — ABNORMAL HIGH (ref 137–147)
Lab: 21 mg/dL (ref 7–25)
Lab: 22 MMOL/L (ref 21–30)
Lab: 7 10*3/uL (ref 3–12)

## 2018-10-17 LAB — MAGNESIUM: Lab: 1.5 mg/dL — ABNORMAL LOW (ref 1.6–2.6)

## 2018-10-17 LAB — CBC AND DIFF
Lab: 0.1 10*3/uL (ref 0–0.45)
Lab: 0.2 10*3/uL (ref 0–0.80)
Lab: 0.7 10*3/uL — ABNORMAL LOW (ref 1.0–4.8)
Lab: 2.7 10*3/uL (ref 1.8–7.0)
Lab: 3.7 10*3/uL — ABNORMAL LOW (ref 4.5–11.0)

## 2018-10-17 LAB — URINALYSIS, MICROSCOPIC

## 2018-10-17 LAB — PROTEIN/CR RATIO,UR RAN: Lab: 18 mg/dL — AB

## 2018-10-17 LAB — PTT (APTT): Lab: 22 s — ABNORMAL LOW (ref 24.0–36.5)

## 2018-10-17 LAB — PHOSPHORUS: Lab: 3.8 mg/dL (ref 2.0–4.5)

## 2018-10-17 LAB — URINALYSIS DIPSTICK
Lab: NEGATIVE % (ref 0–2)
Lab: NEGATIVE % (ref 0–5)
Lab: NEGATIVE % (ref 4–12)
Lab: NEGATIVE mg/dL (ref 7–25)
Lab: NEGATIVE mg/dL — ABNORMAL HIGH (ref 0.4–1.00)
Lab: NEGATIVE mg/dL — ABNORMAL LOW (ref 8.5–10.6)

## 2018-10-17 LAB — POC GLUCOSE
Lab: 111 mg/dL — ABNORMAL HIGH (ref 70–100)
Lab: 132 mg/dL — ABNORMAL HIGH (ref 70–100)
Lab: 144 mg/dL — ABNORMAL HIGH (ref 70–100)

## 2018-10-17 LAB — BNP (B-TYPE NATRIURETIC PEPTI): Lab: 71 pg/mL (ref 0–100)

## 2018-10-17 LAB — PROTIME INR (PT): Lab: 1 FL (ref 0.8–1.2)

## 2018-10-17 MED ORDER — FENTANYL CITRATE (PF) 50 MCG/ML IJ SOLN
25 ug | INTRAVENOUS | 0 refills | Status: DC | PRN
Start: 2018-10-17 — End: 2018-10-18
  Administered 2018-10-17 – 2018-10-18 (×13): 25 ug via INTRAVENOUS

## 2018-10-17 MED ORDER — BUSPIRONE 5 MG PO TAB
7.5 mg | Freq: Two times a day (BID) | ORAL | 0 refills | Status: DC
Start: 2018-10-17 — End: 2018-10-29
  Administered 2018-10-17 – 2018-10-29 (×25): 7.5 mg via ORAL

## 2018-10-17 MED ORDER — SENNOSIDES-DOCUSATE SODIUM 8.6-50 MG PO TAB
2 | Freq: Two times a day (BID) | ORAL | 0 refills | Status: DC
Start: 2018-10-17 — End: 2018-10-30
  Administered 2018-10-17 – 2018-10-29 (×24): 2 via ORAL

## 2018-10-17 MED ORDER — ALBUTEROL SULFATE 90 MCG/ACTUATION IN HFAA
1-2 | RESPIRATORY_TRACT | 0 refills | Status: DC | PRN
Start: 2018-10-17 — End: 2018-11-04
  Administered 2018-10-18 – 2018-11-04 (×3): 2 via RESPIRATORY_TRACT

## 2018-10-17 MED ORDER — SUCRALFATE 1 GRAM PO TAB
1 g | Freq: Four times a day (QID) | ORAL | 0 refills | Status: DC
Start: 2018-10-17 — End: 2018-10-19
  Administered 2018-10-17 – 2018-10-19 (×10): 1 g via ORAL

## 2018-10-17 MED ORDER — ASPIRIN 81 MG PO TBEC
81 mg | Freq: Every day | ORAL | 0 refills | Status: DC
Start: 2018-10-17 — End: 2018-10-29
  Administered 2018-10-17 – 2018-10-29 (×11): 81 mg via ORAL

## 2018-10-17 MED ORDER — PANTOPRAZOLE 40 MG PO TBEC
40 mg | Freq: Every day | ORAL | 0 refills | Status: DC
Start: 2018-10-17 — End: 2018-10-18
  Administered 2018-10-17 – 2018-10-18 (×2): 40 mg via ORAL

## 2018-10-17 MED ORDER — GABAPENTIN 100 MG PO CAP
100 mg | Freq: Three times a day (TID) | ORAL | 0 refills | Status: DC
Start: 2018-10-17 — End: 2018-10-23
  Administered 2018-10-17 – 2018-10-23 (×19): 100 mg via ORAL

## 2018-10-17 MED ORDER — DOCUSATE SODIUM 100 MG PO CAP
100 mg | Freq: Two times a day (BID) | ORAL | 0 refills | Status: DC | PRN
Start: 2018-10-17 — End: 2018-10-20

## 2018-10-17 MED ORDER — ONDANSETRON HCL 4 MG PO TAB
4 mg | ORAL | 0 refills | Status: DC | PRN
Start: 2018-10-17 — End: 2018-11-02
  Administered 2018-10-17 – 2018-11-02 (×6): 4 mg via ORAL

## 2018-10-17 MED ORDER — CETIRIZINE 10 MG PO TAB
10 mg | Freq: Every evening | ORAL | 0 refills | Status: DC
Start: 2018-10-17 — End: 2018-10-30
  Administered 2018-10-18 – 2018-10-29 (×11): 10 mg via ORAL

## 2018-10-17 MED ORDER — HEPARIN (PORCINE) 10,000 UNIT/ML IJ SOLN (IMS)
7500 [IU] | SUBCUTANEOUS | 0 refills | Status: DC
Start: 2018-10-17 — End: 2018-10-18
  Administered 2018-10-17 – 2018-10-18 (×3): 7500 [IU] via SUBCUTANEOUS

## 2018-10-17 MED ORDER — ATORVASTATIN 40 MG PO TAB
40 mg | Freq: Every evening | ORAL | 0 refills | Status: DC
Start: 2018-10-17 — End: 2018-11-16
  Administered 2018-10-18 – 2018-11-15 (×29): 40 mg via ORAL

## 2018-10-17 MED ORDER — HEPARIN, PORCINE (PF) 5,000 UNIT/0.5 ML IJ SYRG
5000 [IU] | SUBCUTANEOUS | 0 refills | Status: DC
Start: 2018-10-17 — End: 2018-10-17
  Administered 2018-10-17: 12:00:00 5000 [IU] via SUBCUTANEOUS

## 2018-10-17 MED ORDER — FENTANYL CITRATE (PF) 50 MCG/ML IJ SOLN
25 ug | Freq: Once | INTRAVENOUS | 0 refills | Status: CP
Start: 2018-10-17 — End: ?

## 2018-10-17 MED ORDER — POLYETHYLENE GLYCOL 3350 17 GRAM PO PWPK
34 g | Freq: Every day | ORAL | 0 refills | Status: DC | PRN
Start: 2018-10-17 — End: 2018-10-30
  Administered 2018-10-25 – 2018-10-28 (×2): 34 g via ORAL

## 2018-10-17 MED ORDER — AMLODIPINE 5 MG PO TAB
5 mg | Freq: Every day | ORAL | 0 refills | Status: DC
Start: 2018-10-17 — End: 2018-10-19
  Administered 2018-10-17 – 2018-10-19 (×3): 5 mg via ORAL

## 2018-10-17 MED ORDER — CYCLOBENZAPRINE 10 MG PO TAB
10 mg | Freq: Three times a day (TID) | ORAL | 0 refills | Status: DC | PRN
Start: 2018-10-17 — End: 2018-10-17
  Administered 2018-10-17 (×2): 10 mg via ORAL

## 2018-10-18 LAB — BLOOD GASES, ARTERIAL
Lab: 2.4 MMOL/L
Lab: 22 MMOL/L (ref 21–28)
Lab: 36 mmHg (ref 35–45)
Lab: 89 mmHg (ref 80–100)
Lab: 97 % (ref 95–99)
Lab: 7.4 (ref 7.35–7.45)

## 2018-10-18 LAB — POC GLUCOSE
Lab: 129 mg/dL — ABNORMAL HIGH (ref 70–100)
Lab: 120 mg/dL — ABNORMAL HIGH (ref 70–100)
Lab: 140 mg/dL — ABNORMAL HIGH (ref 70–100)
Lab: 130 mg/dL — ABNORMAL HIGH (ref 70–100)

## 2018-10-18 LAB — COMPREHENSIVE METABOLIC PANEL
Lab: 112 mg/dL — ABNORMAL HIGH (ref 70–100)
Lab: 139 MMOL/L — ABNORMAL LOW (ref 137–147)
Lab: 4.2 MMOL/L — ABNORMAL LOW (ref 3.5–5.1)

## 2018-10-18 LAB — CBC AND DIFF: Lab: 3.7 K/UL — ABNORMAL LOW (ref 4.5–11.0)

## 2018-10-18 LAB — PERIPHERAL SMEAR

## 2018-10-18 MED ORDER — MORPHINE 2 MG/ML IV CRTG
2-4 mg | INTRAVENOUS | 0 refills | Status: DC | PRN
Start: 2018-10-18 — End: 2018-10-19
  Administered 2018-10-18 (×2): 2 mg via INTRAVENOUS
  Administered 2018-10-19 (×5): 4 mg via INTRAVENOUS

## 2018-10-18 MED ORDER — LIRAGLUTIDE (18 MG/3 ML) SC INJ PEN
1.8 mg | Freq: Every day | SUBCUTANEOUS | 0 refills | Status: DC
Start: 2018-10-18 — End: 2018-10-29

## 2018-10-18 MED ORDER — PANTOPRAZOLE 40 MG PO TBEC
40 mg | Freq: Two times a day (BID) | ORAL | 0 refills | Status: DC
Start: 2018-10-18 — End: 2018-10-30
  Administered 2018-10-18 – 2018-10-29 (×22): 40 mg via ORAL

## 2018-10-19 LAB — IRON + BINDING CAPACITY + %SAT+ FERRITIN
Lab: 130 ng/mL (ref 10–200)
Lab: 16 % — ABNORMAL LOW (ref 28–42)
Lab: 259 ug/dL — ABNORMAL LOW (ref 270–380)
Lab: 41 ug/dL — ABNORMAL LOW (ref 50–160)

## 2018-10-19 LAB — RETICULOCYTE COUNT
Lab: 0.8 %
Lab: 1.6 % (ref 0.5–2.0)
Lab: 41 10*3/uL (ref 30–94)

## 2018-10-19 LAB — URINALYSIS DIPSTICK
Lab: NEGATIVE
Lab: NEGATIVE
Lab: NEGATIVE
Lab: NEGATIVE
Lab: NEGATIVE
Lab: NEGATIVE
Lab: NEGATIVE

## 2018-10-19 LAB — CREATININE-URINE RANDOM: Lab: 118 mg/dL

## 2018-10-19 LAB — POC GLUCOSE
Lab: 126 mg/dL — ABNORMAL HIGH (ref 70–100)
Lab: 107 mg/dL — ABNORMAL HIGH (ref 70–100)
Lab: 120 mg/dL — ABNORMAL HIGH (ref 70–100)
Lab: 108 mg/dL — ABNORMAL HIGH (ref 70–100)

## 2018-10-19 LAB — SODIUM-URINE RANDOM: Lab: 16 MMOL/L

## 2018-10-19 LAB — COMPREHENSIVE METABOLIC PANEL: Lab: 140 MMOL/L — ABNORMAL LOW (ref 137–147)

## 2018-10-19 LAB — CBC AND DIFF: Lab: 4.1 K/UL — ABNORMAL LOW (ref 4.5–11.0)

## 2018-10-19 LAB — URINALYSIS, MICROSCOPIC

## 2018-10-19 LAB — HAPTOGLOBIN: Lab: 198 mg/dL (ref 16–200)

## 2018-10-19 MED ORDER — MORPHINE 2 MG/ML IV CRTG
2 mg | INTRAVENOUS | 0 refills | Status: DC | PRN
Start: 2018-10-19 — End: 2018-10-24
  Administered 2018-10-20 – 2018-10-24 (×14): 2 mg via INTRAVENOUS

## 2018-10-19 MED ORDER — AMLODIPINE 5 MG PO TAB
5 mg | Freq: Once | ORAL | 0 refills | Status: CP
Start: 2018-10-19 — End: ?
  Administered 2018-10-19: 22:00:00 5 mg via ORAL

## 2018-10-19 MED ORDER — SUCRALFATE 1 GRAM PO TAB
1 g | Freq: Before meals | ORAL | 0 refills | Status: DC
Start: 2018-10-19 — End: 2018-11-16
  Administered 2018-10-19 – 2018-11-15 (×96): 1 g via ORAL

## 2018-10-19 MED ORDER — AMLODIPINE 10 MG PO TAB
10 mg | Freq: Every day | ORAL | 0 refills | Status: DC
Start: 2018-10-19 — End: 2018-10-30
  Administered 2018-10-20 – 2018-10-29 (×10): 10 mg via ORAL

## 2018-10-19 MED ORDER — FUROSEMIDE 10 MG/ML IJ SOLN
40 mg | Freq: Once | INTRAVENOUS | 0 refills | Status: CP
Start: 2018-10-19 — End: ?
  Administered 2018-10-19: 19:00:00 40 mg via INTRAVENOUS

## 2018-10-19 MED ORDER — OXYCODONE 5 MG PO TAB
5-10 mg | ORAL | 0 refills | Status: DC | PRN
Start: 2018-10-19 — End: 2018-10-21
  Administered 2018-10-19 – 2018-10-21 (×9): 10 mg via ORAL

## 2018-10-19 MED ORDER — PERFLUTREN LIPID MICROSPHERES 1.1 MG/ML IV SUSP
1-20 mL | Freq: Once | INTRAVENOUS | 0 refills | Status: CP | PRN
Start: 2018-10-19 — End: ?
  Administered 2018-10-19: 20:00:00 2 mL via INTRAVENOUS

## 2018-10-20 LAB — COMPREHENSIVE METABOLIC PANEL: Lab: 139 MMOL/L — ABNORMAL LOW (ref >60)

## 2018-10-20 LAB — POC GLUCOSE
Lab: 102 mg/dL — ABNORMAL HIGH (ref 70–100)
Lab: 106 mg/dL — ABNORMAL HIGH (ref 70–100)
Lab: 123 mg/dL — ABNORMAL HIGH (ref 70–100)
Lab: 117 mg/dL — ABNORMAL HIGH (ref 70–100)

## 2018-10-20 LAB — CBC AND DIFF: Lab: 4.2 10*3/uL — ABNORMAL LOW (ref >60)

## 2018-10-20 LAB — MAGNESIUM: Lab: 1.5 mg/dL — ABNORMAL LOW (ref 1.6–2.6)

## 2018-10-20 MED ORDER — FUROSEMIDE 10 MG/ML IJ SOLN
40 mg | Freq: Once | INTRAVENOUS | 0 refills | Status: CP
Start: 2018-10-20 — End: ?
  Administered 2018-10-20: 18:00:00 40 mg via INTRAVENOUS

## 2018-10-20 MED ORDER — MAGNESIUM SULFATE IN D5W 1 GRAM/100 ML IV PGBK
1 g | INTRAVENOUS | 0 refills | Status: CP
Start: 2018-10-20 — End: ?
  Administered 2018-10-20 (×2): 1 g via INTRAVENOUS

## 2018-10-20 MED ORDER — ACETAMINOPHEN 325 MG PO TAB
650 mg | ORAL | 0 refills | Status: DC | PRN
Start: 2018-10-20 — End: 2018-10-21

## 2018-10-20 MED ORDER — DEXTROMETHORPHAN-GUAIFENESIN 10-100 MG/5 ML PO SYRP
10 mL | ORAL | 0 refills | Status: DC | PRN
Start: 2018-10-20 — End: 2018-10-30
  Administered 2018-10-20 – 2018-10-29 (×14): 10 mL via ORAL

## 2018-10-21 LAB — MAGNESIUM: Lab: 1.7 mg/dL — ABNORMAL HIGH (ref >60)

## 2018-10-21 LAB — POC GLUCOSE
Lab: 121 mg/dL — ABNORMAL HIGH (ref 70–100)
Lab: 149 mg/dL — ABNORMAL HIGH (ref 70–100)
Lab: 126 mg/dL — ABNORMAL HIGH (ref 70–100)
Lab: 149 mg/dL — ABNORMAL HIGH (ref 70–100)

## 2018-10-21 LAB — CBC AND DIFF: Lab: 4.2 10*3/uL — ABNORMAL LOW (ref 4.5–11.0)

## 2018-10-21 LAB — COMPREHENSIVE METABOLIC PANEL: Lab: 138 MMOL/L — ABNORMAL LOW (ref 137–147)

## 2018-10-21 MED ORDER — MAGNESIUM SULFATE IN D5W 1 GRAM/100 ML IV PGBK
1 g | Freq: Once | INTRAVENOUS | 0 refills | Status: CP
Start: 2018-10-21 — End: ?
  Administered 2018-10-21: 18:00:00 1 g via INTRAVENOUS

## 2018-10-21 MED ORDER — FUROSEMIDE 10 MG/ML IJ SOLN
40 mg | Freq: Once | INTRAVENOUS | 0 refills | Status: CP
Start: 2018-10-21 — End: ?
  Administered 2018-10-21: 18:00:00 40 mg via INTRAVENOUS

## 2018-10-21 MED ORDER — FUROSEMIDE 10 MG/ML IJ SOLN
40 mg | Freq: Once | INTRAVENOUS | 0 refills | Status: CP
Start: 2018-10-21 — End: ?
  Administered 2018-10-22: 01:00:00 40 mg via INTRAVENOUS

## 2018-10-21 MED ORDER — MORPHINE 15 MG PO TAB
7.5-15 mg | ORAL | 0 refills | Status: DC | PRN
Start: 2018-10-21 — End: 2018-10-30
  Administered 2018-10-21: 22:00:00 15 mg via ORAL
  Administered 2018-10-21: 19:00:00 7.5 mg via ORAL
  Administered 2018-10-22 – 2018-10-29 (×26): 15 mg via ORAL

## 2018-10-21 MED ORDER — ACETAMINOPHEN 325 MG PO TAB
650 mg | ORAL | 0 refills | Status: DC
Start: 2018-10-21 — End: 2018-10-29
  Administered 2018-10-21 – 2018-10-29 (×26): 650 mg via ORAL

## 2018-10-22 LAB — POC GLUCOSE
Lab: 138 mg/dL — ABNORMAL HIGH (ref 70–100)
Lab: 137 mg/dL — ABNORMAL HIGH (ref 70–100)
Lab: 112 mg/dL — ABNORMAL HIGH (ref 70–100)
Lab: 117 mg/dL — ABNORMAL HIGH (ref 70–100)

## 2018-10-22 LAB — MAGNESIUM: Lab: 1.7 mg/dL — ABNORMAL LOW (ref >60)

## 2018-10-22 LAB — COMPREHENSIVE METABOLIC PANEL: Lab: 140 MMOL/L — ABNORMAL LOW (ref >60)

## 2018-10-22 LAB — CBC AND DIFF: Lab: 3.8 K/UL — ABNORMAL LOW (ref >60)

## 2018-10-22 MED ORDER — ALBUMIN, HUMAN 25 % IV SOLP
25 g | Freq: Once | INTRAVENOUS | 0 refills | Status: CP
Start: 2018-10-22 — End: ?
  Administered 2018-10-22: 18:00:00 25 g via INTRAVENOUS

## 2018-10-22 MED ORDER — HYDROXYZINE HCL 25 MG PO TAB
25 mg | Freq: Three times a day (TID) | ORAL | 0 refills | Status: DC | PRN
Start: 2018-10-22 — End: 2018-10-30
  Administered 2018-10-22 – 2018-10-28 (×8): 25 mg via ORAL

## 2018-10-22 MED ORDER — MELATONIN 3 MG PO TAB
3 mg | Freq: Every evening | ORAL | 0 refills | Status: DC
Start: 2018-10-22 — End: 2018-10-29
  Administered 2018-10-23 – 2018-10-29 (×7): 3 mg via ORAL

## 2018-10-22 MED ORDER — AMITRIPTYLINE(#)/GABAPENTIN/EMU OIL 4/4/10%IN HRT TOPICAL CRM
Freq: Three times a day (TID) | TOPICAL | 0 refills | Status: DC
Start: 2018-10-22 — End: 2018-11-13
  Administered 2018-10-22 – 2018-10-31 (×4): via TOPICAL

## 2018-10-22 MED ORDER — SERTRALINE 25 MG PO TAB
25 mg | Freq: Every day | ORAL | 0 refills | Status: DC
Start: 2018-10-22 — End: 2018-10-29
  Administered 2018-10-22 – 2018-10-29 (×7): 25 mg via ORAL

## 2018-10-22 MED ORDER — FUROSEMIDE 10 MG/ML IJ SOLN
40 mg | Freq: Once | INTRAVENOUS | 0 refills | Status: CP
Start: 2018-10-22 — End: ?
  Administered 2018-10-22: 18:00:00 40 mg via INTRAVENOUS

## 2018-10-23 ENCOUNTER — Encounter: Admit: 2018-10-23 | Discharge: 2018-10-23

## 2018-10-23 LAB — CBC AND DIFF
Lab: 3.6 K/UL — ABNORMAL LOW (ref >60)
Lab: 0 % (ref 0–2)
Lab: 0.9 10*3/uL — ABNORMAL LOW (ref 1.0–4.8)
Lab: 14 % (ref 11–15)
Lab: 2.5 M/UL — ABNORMAL LOW (ref 4.0–5.0)
Lab: 2.6 10*3/uL (ref 1.8–7.0)
Lab: 21 % — ABNORMAL LOW (ref 36–45)
Lab: 217 10*3/uL (ref 150–400)
Lab: 25 % (ref 24–44)
Lab: 28 pg (ref 26–34)
Lab: 3 % (ref 0–5)
Lab: 3.8 10*3/uL — ABNORMAL LOW (ref 4.5–11.0)
Lab: 33 g/dL (ref 32.0–36.0)
Lab: 4 % (ref 4–12)
Lab: 68 % (ref 41–77)
Lab: 7.2 FL (ref 7–11)
Lab: 7.2 g/dL — ABNORMAL LOW (ref 12.0–15.0)
Lab: 85 FL (ref 80–100)

## 2018-10-23 LAB — POC GLUCOSE
Lab: 122 mg/dL — ABNORMAL HIGH (ref 70–100)
Lab: 108 mg/dL — ABNORMAL HIGH (ref 70–100)
Lab: 109 mg/dL — ABNORMAL HIGH (ref 70–100)
Lab: 153 mg/dL — ABNORMAL HIGH (ref 70–100)

## 2018-10-23 LAB — MAGNESIUM: Lab: 1.7 mg/dL — ABNORMAL LOW (ref >60)

## 2018-10-23 LAB — COMPREHENSIVE METABOLIC PANEL: Lab: 140 MMOL/L — ABNORMAL LOW (ref >60)

## 2018-10-23 MED ORDER — GABAPENTIN 100 MG PO CAP
200 mg | Freq: Three times a day (TID) | ORAL | 0 refills | Status: DC
Start: 2018-10-23 — End: 2018-10-30
  Administered 2018-10-23 – 2018-10-29 (×19): 200 mg via ORAL

## 2018-10-23 MED ORDER — MAGNESIUM SULFATE IN D5W 1 GRAM/100 ML IV PGBK
1 g | Freq: Once | INTRAVENOUS | 0 refills | Status: CP
Start: 2018-10-23 — End: ?
  Administered 2018-10-23: 17:00:00 1 g via INTRAVENOUS

## 2018-10-23 MED ORDER — ALBUMIN, HUMAN 25 % IV SOLP
25 g | Freq: Once | INTRAVENOUS | 0 refills | Status: CP
Start: 2018-10-23 — End: ?
  Administered 2018-10-23: 16:00:00 25 g via INTRAVENOUS

## 2018-10-23 MED ORDER — FUROSEMIDE 10 MG/ML IJ SOLN
40 mg | Freq: Two times a day (BID) | INTRAVENOUS | 0 refills | Status: CP
Start: 2018-10-23 — End: ?
  Administered 2018-10-23 – 2018-10-25 (×6): 40 mg via INTRAVENOUS

## 2018-10-23 MED ORDER — INSULIN ASPART 100 UNIT/ML SC FLEXPEN
0-6 [IU] | Freq: Before meals | SUBCUTANEOUS | 0 refills | Status: DC
Start: 2018-10-23 — End: 2018-10-29

## 2018-10-23 MED ORDER — PREDNISONE 20 MG PO TAB
40 mg | Freq: Every day | ORAL | 0 refills | Status: CP
Start: 2018-10-23 — End: ?
  Administered 2018-10-23 – 2018-10-27 (×5): 40 mg via ORAL

## 2018-10-24 LAB — BASIC METABOLIC PANEL
Lab: 1.7 mg/dL — ABNORMAL HIGH (ref 0.4–1.00)
Lab: 105 MMOL/L (ref 98–110)
Lab: 138 MMOL/L (ref 137–147)
Lab: 142 mg/dL — ABNORMAL HIGH (ref 70–100)
Lab: 25 MMOL/L (ref 21–30)
Lab: 29 mg/dL — ABNORMAL HIGH (ref 7–25)
Lab: 30 mL/min — ABNORMAL LOW (ref >60)
Lab: 36 mL/min — ABNORMAL LOW (ref >60)
Lab: 5 MMOL/L (ref 3.5–5.1)
Lab: 8.4 mg/dL — ABNORMAL LOW (ref 8.5–10.6)
Lab: 8 (ref 3–12)

## 2018-10-24 LAB — POC GLUCOSE
Lab: 158 mg/dL — ABNORMAL HIGH (ref 70–100)
Lab: 148 mg/dL — ABNORMAL HIGH (ref 70–100)
Lab: 150 mg/dL — ABNORMAL HIGH (ref 70–100)
Lab: 171 mg/dL — ABNORMAL HIGH (ref 70–100)

## 2018-10-24 LAB — MAGNESIUM: Lab: 1.6 mg/dL — ABNORMAL LOW (ref 1.6–2.6)

## 2018-10-24 LAB — COMPREHENSIVE METABOLIC PANEL: Lab: 138 MMOL/L — ABNORMAL LOW (ref >60)

## 2018-10-24 LAB — CBC AND DIFF: Lab: 6.7 K/UL — ABNORMAL LOW (ref >60)

## 2018-10-24 MED ORDER — MORPHINE 2 MG/ML IV CRTG
2 mg | INTRAVENOUS | 0 refills | Status: DC | PRN
Start: 2018-10-24 — End: 2018-10-29
  Administered 2018-10-24 – 2018-10-28 (×9): 2 mg via INTRAVENOUS

## 2018-10-24 MED ORDER — MAGNESIUM SULFATE IN D5W 1 GRAM/100 ML IV PGBK
1 g | Freq: Once | INTRAVENOUS | 0 refills | Status: CP
Start: 2018-10-24 — End: ?
  Administered 2018-10-24: 17:00:00 1 g via INTRAVENOUS

## 2018-10-25 ENCOUNTER — Encounter: Admit: 2018-10-25 | Discharge: 2018-10-25 | Payer: MEDICARE

## 2018-10-25 LAB — POC GLUCOSE
Lab: 182 mg/dL — ABNORMAL HIGH (ref 70–100)
Lab: 127 mg/dL — ABNORMAL HIGH (ref 70–100)
Lab: 146 mg/dL — ABNORMAL HIGH (ref 70–100)
Lab: 144 mg/dL — ABNORMAL HIGH (ref 70–100)

## 2018-10-25 LAB — MAGNESIUM: Lab: 1.8 mg/dL — ABNORMAL LOW (ref 1.6–2.6)

## 2018-10-25 LAB — CBC AND DIFF: Lab: 6.6 K/UL — ABNORMAL LOW (ref >60)

## 2018-10-25 LAB — COMPREHENSIVE METABOLIC PANEL: Lab: 138 MMOL/L — ABNORMAL LOW (ref >60)

## 2018-10-25 MED ORDER — FLUTICASONE PROPIONATE 50 MCG/ACTUATION NA SPSN
2 | Freq: Two times a day (BID) | NASAL | 0 refills | Status: DC
Start: 2018-10-25 — End: 2018-10-30
  Administered 2018-10-25: 17:00:00 2 via NASAL

## 2018-10-25 MED ORDER — HYDRALAZINE 20 MG/ML IJ SOLN
10 mg | INTRAVENOUS | 0 refills | Status: DC | PRN
Start: 2018-10-25 — End: 2018-10-30
  Administered 2018-10-28 – 2018-10-29 (×2): 10 mg via INTRAVENOUS

## 2018-10-26 ENCOUNTER — Encounter: Admit: 2018-10-26 | Discharge: 2018-10-26 | Payer: MEDICARE

## 2018-10-26 DIAGNOSIS — I10 Essential (primary) hypertension: Secondary | ICD-10-CM

## 2018-10-26 DIAGNOSIS — J449 Chronic obstructive pulmonary disease, unspecified: Secondary | ICD-10-CM

## 2018-10-26 DIAGNOSIS — E119 Type 2 diabetes mellitus without complications: Secondary | ICD-10-CM

## 2018-10-26 DIAGNOSIS — E785 Hyperlipidemia, unspecified: Secondary | ICD-10-CM

## 2018-10-26 DIAGNOSIS — N141 Nephropathy induced by other drugs, medicaments and biological substances: Secondary | ICD-10-CM

## 2018-10-26 LAB — POC GLUCOSE
Lab: 155 mg/dL — ABNORMAL HIGH (ref 70–100)
Lab: 111 mg/dL — ABNORMAL HIGH (ref 70–100)
Lab: 96 mg/dL (ref 70–100)
Lab: 97 mg/dL (ref 70–100)
Lab: 160 mg/dL — ABNORMAL HIGH (ref 70–100)

## 2018-10-26 LAB — COMPREHENSIVE METABOLIC PANEL: Lab: 140 MMOL/L — ABNORMAL LOW (ref >60)

## 2018-10-26 LAB — MAGNESIUM: Lab: 1.7 mg/dL — ABNORMAL LOW (ref >60)

## 2018-10-26 LAB — CBC AND DIFF: Lab: 6.3 K/UL — ABNORMAL LOW (ref >60)

## 2018-10-26 MED ORDER — BARIUM SULFATE 700 MG PO TAB
700 mg | Freq: Once | ORAL | 0 refills | Status: CP
Start: 2018-10-26 — End: ?
  Administered 2018-10-26: 19:00:00 700 mg via ORAL

## 2018-10-26 MED ORDER — BARIUM SULFATE 98 % PO SUSR
80 mL | Freq: Once | ORAL | 0 refills | Status: CP
Start: 2018-10-26 — End: ?
  Administered 2018-10-26: 19:00:00 80 mL via ORAL

## 2018-10-26 MED ORDER — PROPOFOL INJ 10 MG/ML IV VIAL
0 refills | Status: DC
Start: 2018-10-26 — End: 2018-10-26
  Administered 2018-10-26 (×2): 20 mg via INTRAVENOUS
  Administered 2018-10-26: 15:00:00 50 mg via INTRAVENOUS
  Administered 2018-10-26: 15:00:00 10 mg via INTRAVENOUS
  Administered 2018-10-26: 15:00:00 30 mg via INTRAVENOUS

## 2018-10-26 MED ORDER — SOD BICARB-CITRIC AC-SIMETH 2.21-1.53 GRAM/4 GRAM PO GREP
1 | Freq: Once | ORAL | 0 refills | Status: CP
Start: 2018-10-26 — End: ?

## 2018-10-26 MED ORDER — BARIUM SULFATE 40 % (W/V) PO POWD
70 mL | Freq: Once | ORAL | 0 refills | Status: CP
Start: 2018-10-26 — End: ?
  Administered 2018-10-26: 19:00:00 70 mL via ORAL

## 2018-10-26 MED ORDER — LIDOCAINE (PF) 200 MG/10 ML (2 %) IJ SYRG
0 refills | Status: DC
Start: 2018-10-26 — End: 2018-10-26
  Administered 2018-10-26: 15:00:00 50 mg via INTRAVENOUS

## 2018-10-26 MED ORDER — SODIUM CHLORIDE 0.9 % IV SOLP
INTRAVENOUS | 0 refills | Status: AC
Start: 2018-10-26 — End: ?
  Administered 2018-10-26: 14:00:00 1000 mL via INTRAVENOUS

## 2018-10-26 NOTE — H&P (View-Only)
Pre Procedure History and Physical/Sedation Plan    Name:Alexandria Morgan                                                                   MRN: [id number]                 DOB:Aug 04, 1961          Age: 58 y.o.  Admission Date: 10/16/2018             Days Admitted: LOS: 9 days      Procedure Date:  10/26/2018    Planned Procedure(s):  GI:  EGD  Sedation/Medication Plan:  MAC (Monitored Anesthesia Care)  Discussion/Reviews:  Physician has discussed risks and alternatives of this type of sedation and above planned procedures with patient  ________________________________________________________________  Chief Complaint:  Inpatient endo consult note reviewed.      Previous Anesthetic/Sedation History:  MAC    Allergies:  Codeine; Darvocet [propoxyphene n-acetaminophen]; Dilaudid [hydromorphone]; Tetracycline; and Adhesive tape (rosins)  Medications:  Scheduled Meds:[MAR Hold] acetaminophen (TYLENOL) tablet 650 mg, 650 mg, Oral, Q6H while awake  amitriptyline/gabapentin/emu oil(#) 01/18/09 % topical cream, , Topical, TID  [MAR Hold] amLODIPine (NORVASC) tablet 10 mg, 10 mg, Oral, QDAY  [MAR Hold] aspirin EC tablet 81 mg, 81 mg, Oral, QDAY  [MAR Hold] atorvastatin (LIPITOR) tablet 40 mg, 40 mg, Oral, QHS  [MAR Hold] busPIRone (BUSPAR) tablet 7.5 mg, 7.5 mg, Oral, BID  [MAR Hold] cetirizine (ZYRTEC) tablet 10 mg, 10 mg, Oral, QHS  [MAR Hold] fluticasone propionate (FLONASE) nasal spray 2 spray, 2 spray, Each Nostril, BID  gabapentin (NEURONTIN) capsule 200 mg, 200 mg, Oral, TID  [MAR Hold] insulin aspart U-100 (NOVOLOG FLEXPEN) injection PEN 0-6 Units, 0-6 Units, Subcutaneous, ACHS (22)  [MAR Hold] liraglutide (VICTOZA) injection pen 1.8 mg, 1.8 mg, Subcutaneous, QDAY  [MAR Hold] melatonin tablet 3 mg, 3 mg, Oral, QHS  [MAR Hold] pantoprazole DR (PROTONIX) tablet 40 mg, 40 mg, Oral, JXB(14-78)  [MAR Hold] prednisone (DELTASONE) tablet 40 mg, 40 mg, Oral, QDAY  [MAR Hold] senna/docusate (SENOKOT-S) tablet 2 tablet, 2 tablet, Oral, BID [MAR Hold] sertraline (ZOLOFT) tablet 25 mg, 25 mg, Oral, QDAY  [MAR Hold] sucralfate (CARAFATE) tablet 1 g, 1 g, Oral, ACHS    Continuous Infusions:  ??? sodium chloride 0.9 %   infusion       PRN and Respiratory Meds:[MAR Hold] albuterol sulfate Q4H PRN, [MAR Hold] dextromethorphan/guaiFENesin Q4H PRN, [MAR Hold] hydrALAZINE Q6H PRN, [MAR Hold] hydrOXYzine TID PRN, [MAR Hold] morphine injection Q6H PRN, [MAR Hold] morphine IR Q4H PRN, [MAR Hold] ondansetron Q4H PRN, [MAR Hold] polyethylene glycol 3350 QDAY PRN       Vital Signs:  Last Filed Vital Signs: 24 Hour Range   BP: 108/24 (01/10 0742)  Temp: 36.8 ???C (98.2 ???F) (01/10 2956)  Pulse: 80 (01/10 0742)  Respirations: 24 PER MINUTE (01/10 0742)  SpO2: 100 % (01/10 0742) BP: (108-162)/(24-119)   Temp:  [36.2 ???C (97.2 ???F)-36.8 ???C (98.2 ???F)]   Pulse:  [66-80]   Respirations:  [12 PER MINUTE-24 PER MINUTE]   SpO2:  [97 %-100 %]      NPO Status:     Airway:  Per anesthesia  Anesthesia Classification:  Per Anesthesia  NPO Status: Acceptable  Preganancy Status: Not Pregnant    Lab/Radiology/Other Diagnostic Tests  Labs:  Relevant labs reviewed    Franklyn Lor, MD  Pager

## 2018-10-27 ENCOUNTER — Encounter: Admit: 2018-10-27 | Discharge: 2018-10-27

## 2018-10-27 DIAGNOSIS — I10 Essential (primary) hypertension: Secondary | ICD-10-CM

## 2018-10-27 DIAGNOSIS — N141 Nephropathy induced by other drugs, medicaments and biological substances: Secondary | ICD-10-CM

## 2018-10-27 DIAGNOSIS — E119 Type 2 diabetes mellitus without complications: Secondary | ICD-10-CM

## 2018-10-27 DIAGNOSIS — E785 Hyperlipidemia, unspecified: Secondary | ICD-10-CM

## 2018-10-27 DIAGNOSIS — J449 Chronic obstructive pulmonary disease, unspecified: Secondary | ICD-10-CM

## 2018-10-27 LAB — CBC AND DIFF: Lab: 6.4 K/UL — ABNORMAL LOW (ref >60)

## 2018-10-27 LAB — COMPREHENSIVE METABOLIC PANEL: Lab: 138 MMOL/L — ABNORMAL LOW (ref >60)

## 2018-10-27 LAB — POC GLUCOSE
Lab: 174 mg/dL — ABNORMAL HIGH (ref 70–100)
Lab: 164 mg/dL — ABNORMAL HIGH (ref 70–100)
Lab: 107 mg/dL — ABNORMAL HIGH (ref 70–100)
Lab: 130 mg/dL — ABNORMAL HIGH (ref 70–100)
Lab: 156 mg/dL — ABNORMAL HIGH (ref 70–100)

## 2018-10-27 LAB — MAGNESIUM: Lab: 1.8 mg/dL — ABNORMAL LOW (ref 1.6–2.6)

## 2018-10-27 MED ORDER — SODIUM CHLORIDE 0.65 % NA SPRA
1-2 | NASAL | 0 refills | Status: DC | PRN
Start: 2018-10-27 — End: 2018-10-30
  Administered 2018-10-27: 13:00:00 2 via NASAL

## 2018-10-27 MED ORDER — FAMOTIDINE 20 MG PO TAB
20 mg | Freq: Every evening | ORAL | 0 refills | Status: DC
Start: 2018-10-27 — End: 2018-10-30
  Administered 2018-10-28 – 2018-10-29 (×2): 20 mg via ORAL

## 2018-10-27 MED ORDER — CYCLOBENZAPRINE 10 MG PO TAB
10 mg | Freq: Three times a day (TID) | ORAL | 0 refills | Status: DC
Start: 2018-10-27 — End: 2018-10-29
  Administered 2018-10-27 – 2018-10-29 (×8): 10 mg via ORAL

## 2018-10-28 ENCOUNTER — Encounter: Admit: 2018-10-28 | Discharge: 2018-10-28

## 2018-10-28 LAB — POC GLUCOSE
Lab: 102 mg/dL — ABNORMAL HIGH (ref 70–100)
Lab: 103 mg/dL — ABNORMAL HIGH (ref 70–100)
Lab: 122 mg/dL — ABNORMAL HIGH (ref 70–100)
Lab: 172 mg/dL — ABNORMAL HIGH (ref 70–100)
Lab: 95 mg/dL (ref 70–100)

## 2018-10-28 LAB — COMPREHENSIVE METABOLIC PANEL: Lab: 140 MMOL/L — ABNORMAL LOW (ref 137–147)

## 2018-10-28 LAB — MAGNESIUM: Lab: 1.6 mg/dL — ABNORMAL LOW (ref >60)

## 2018-10-28 LAB — CBC AND DIFF: Lab: 9.4 K/UL — ABNORMAL LOW (ref >60)

## 2018-10-28 MED ORDER — PROCHLORPERAZINE EDISYLATE 5 MG/ML IJ SOLN
10 mg | INTRAVENOUS | 0 refills | Status: DC | PRN
Start: 2018-10-28 — End: 2018-10-30
  Administered 2018-10-28: 12:00:00 10 mg via INTRAVENOUS

## 2018-10-28 MED ORDER — POLYETHYLENE GLYCOL 3350 17 GRAM PO PWPK
1 | Freq: Every day | ORAL | 0 refills | Status: DC
Start: 2018-10-28 — End: 2018-11-16
  Administered 2018-10-28 – 2018-11-15 (×18): 17 g via ORAL

## 2018-10-28 MED ORDER — BISACODYL 10 MG RE SUPP
10 mg | Freq: Once | RECTAL | 0 refills | Status: CP
Start: 2018-10-28 — End: ?
  Administered 2018-10-28: 17:00:00 10 mg via RECTAL

## 2018-10-29 ENCOUNTER — Inpatient Hospital Stay: Admit: 2018-10-29 | Discharge: 2018-10-29

## 2018-10-29 DIAGNOSIS — D638 Anemia in other chronic diseases classified elsewhere: Secondary | ICD-10-CM

## 2018-10-29 LAB — POC BLOOD GAS ARTERIAL
Lab: 24 MMOL/L (ref 21–28)
Lab: 3 MMOL/L
Lab: 52 mmHg — ABNORMAL LOW (ref 80–100)
Lab: 61 mmHg — ABNORMAL HIGH (ref 35–45)
Lab: 7.2 — ABNORMAL LOW (ref 7.35–7.45)
Lab: 78 % — ABNORMAL LOW (ref 95–99)

## 2018-10-29 LAB — CBC AND DIFF
Lab: 7.1 K/UL (ref 4.5–11.0)
Lab: 0.1 10*3/uL (ref 0–0.20)
Lab: 12 10*3/uL — ABNORMAL HIGH (ref 4.5–11.0)

## 2018-10-29 LAB — POC SODIUM: Lab: 139 MMOL/L (ref 137–147)

## 2018-10-29 LAB — MAGNESIUM: Lab: 1.6 mg/dL — ABNORMAL LOW (ref >60)

## 2018-10-29 LAB — POC GLUCOSE
Lab: 132 mg/dL — ABNORMAL HIGH (ref 70–100)
Lab: 117 mg/dL — ABNORMAL HIGH (ref 70–100)
Lab: 119 mg/dL — ABNORMAL HIGH (ref 70–100)
Lab: 159 mg/dL — ABNORMAL HIGH (ref 70–100)
Lab: 170 mg/dL — ABNORMAL HIGH (ref 70–100)

## 2018-10-29 LAB — LACTIC ACID (BG - RAPID LACTATE): Lab: 1.9 MMOL/L (ref 0.5–2.0)

## 2018-10-29 LAB — POC HEMATOCRIT
Lab: 28 % — ABNORMAL LOW (ref 36–45)
Lab: 9.5 g/dL — ABNORMAL LOW (ref 12.0–15.0)

## 2018-10-29 LAB — BLOOD GASES, ARTERIAL
Lab: 1.2 MMOL/L
Lab: 50 mmHg — ABNORMAL HIGH (ref 35–45)
Lab: 7.3 — ABNORMAL LOW (ref 7.35–7.45)
Lab: 83 mmHg (ref 80–100)
Lab: 94 % — ABNORMAL LOW (ref 95–99)

## 2018-10-29 LAB — POC POTASSIUM: Lab: 4.1 MMOL/L (ref 3.5–5.1)

## 2018-10-29 LAB — POC IONIZED CALCIUM: Lab: 1.2 MMOL/L (ref 1.0–1.3)

## 2018-10-29 LAB — PROTIME INR (PT): Lab: 1.2 M/UL — ABNORMAL LOW (ref 0.8–1.2)

## 2018-10-29 LAB — COMPREHENSIVE METABOLIC PANEL: Lab: 142 MMOL/L — ABNORMAL LOW (ref 137–147)

## 2018-10-29 MED ORDER — IPRATROPIUM BROMIDE 0.02 % IN SOLN
.5 mg | RESPIRATORY_TRACT | 0 refills | Status: DC | PRN
Start: 2018-10-29 — End: 2018-11-01
  Administered 2018-10-30 – 2018-11-01 (×12): 0.5 mg via RESPIRATORY_TRACT

## 2018-10-29 MED ORDER — FUROSEMIDE 200 MG IN D5W 100 ML IV DRIP
10 mg/h | INTRAVENOUS | 0 refills | Status: DC
Start: 2018-10-29 — End: 2018-10-30
  Administered 2018-10-30: 01:00:00 10 mg/h via INTRAVENOUS
  Administered 2018-10-30: 14:00:00 20 mg/h via INTRAVENOUS
  Administered 2018-10-30: 01:00:00 10 mg/h via INTRAVENOUS
  Administered 2018-10-30: 14:00:00 20 mg/h via INTRAVENOUS

## 2018-10-29 MED ORDER — ETOMIDATE 2 MG/ML IV SOLN
50 mg | Freq: Once | INTRAVENOUS | 0 refills | Status: CP
Start: 2018-10-29 — End: ?

## 2018-10-29 MED ORDER — FUROSEMIDE 10 MG/ML IJ SOLN
80 mg | Freq: Once | INTRAVENOUS | 0 refills | Status: CP
Start: 2018-10-29 — End: ?
  Administered 2018-10-29: 18:00:00 80 mg via INTRAVENOUS

## 2018-10-29 MED ORDER — SUCCINYLCHOLINE CHLORIDE 20 MG/ML IJ SOLN
180 mg | Freq: Once | INTRAVENOUS | 0 refills | Status: CP
Start: 2018-10-29 — End: ?
  Administered 2018-10-29: 23:00:00 180 mg via INTRAVENOUS

## 2018-10-29 MED ORDER — LABETALOL 5 MG/ML IV SYRG
20 mg | Freq: Once | INTRAVENOUS | 0 refills | Status: CP
Start: 2018-10-29 — End: ?
  Administered 2018-10-29: 23:00:00 20 mg via INTRAVENOUS

## 2018-10-29 MED ORDER — ACETAMINOPHEN 160 MG/5 ML PO SOLN
650 mg | ORAL | 0 refills | Status: DC | PRN
Start: 2018-10-29 — End: 2018-11-01
  Administered 2018-10-30 – 2018-11-01 (×2): 650 mg via ORAL

## 2018-10-29 MED ORDER — FENTANYL PCA/DRIP IN NS 1000MCG/100ML
10-100 ug/h | INTRAVENOUS | 0 refills | Status: DC
Start: 2018-10-29 — End: 2018-10-31
  Administered 2018-10-29: 25 ug/h via INTRAVENOUS
  Administered 2018-10-30 – 2018-10-31 (×2): 50 ug/h via INTRAVENOUS

## 2018-10-29 MED ORDER — SERTRALINE 50 MG PO TAB
50 mg | Freq: Every day | ORAL | 0 refills | Status: DC
Start: 2018-10-29 — End: 2018-10-29

## 2018-10-29 MED ORDER — VANCOMYCIN 2,500 MG IVPB
2500 mg | Freq: Once | INTRAVENOUS | 0 refills | Status: CP
Start: 2018-10-29 — End: ?
  Administered 2018-10-30 (×2): 2500 mg via INTRAVENOUS

## 2018-10-29 MED ORDER — ALBUTEROL SULFATE 2.5 MG/0.5 ML IN NEBU
2.5 mg | Freq: Four times a day (QID) | RESPIRATORY_TRACT | 0 refills | Status: DC | PRN
Start: 2018-10-29 — End: 2018-10-30
  Administered 2018-10-29: 19:00:00 2.5 mg via RESPIRATORY_TRACT

## 2018-10-29 MED ORDER — PANTOPRAZOLE 40 MG IV SOLR
40 mg | Freq: Two times a day (BID) | INTRAVENOUS | 0 refills | Status: DC
Start: 2018-10-29 — End: 2018-10-31
  Administered 2018-10-30 – 2018-10-31 (×4): 40 mg via INTRAVENOUS

## 2018-10-29 MED ORDER — FENTANYL CITRATE (PF) 50 MCG/ML IJ SOLN
50 ug | Freq: Once | INTRAVENOUS | 0 refills | Status: CP
Start: 2018-10-29 — End: ?
  Administered 2018-10-29: 23:00:00 50 ug via INTRAVENOUS

## 2018-10-29 MED ORDER — PROPOFOL 10 MG/ML IV EMUL
5-150 ug/kg/min | INTRAVENOUS | 0 refills | Status: DC
Start: 2018-10-29 — End: 2018-10-31
  Administered 2018-10-29: 23:00:00 40 ug/kg/min via INTRAVENOUS
  Administered 2018-10-29: 100 ug/kg/min via INTRAVENOUS
  Administered 2018-10-30: 02:00:00 30 ug/kg/min via INTRAVENOUS
  Administered 2018-10-30 (×2): 50 ug/kg/min via INTRAVENOUS
  Administered 2018-10-30: 05:00:00 30 ug/kg/min via INTRAVENOUS
  Administered 2018-10-30: 11:00:00 40 ug/kg/min via INTRAVENOUS
  Administered 2018-10-30: 19:00:00 15 ug/kg/min via INTRAVENOUS
  Administered 2018-10-30: 07:00:00 40 ug/kg/min via INTRAVENOUS

## 2018-10-29 MED ORDER — PIPERACILLIN/TAZOBACTAM 3.375 G/NS IVPB (MB+)
3.375 g | INTRAVENOUS | 0 refills | Status: DC
Start: 2018-10-29 — End: 2018-10-30
  Administered 2018-10-30 (×6): 3.375 g via INTRAVENOUS

## 2018-10-29 MED ORDER — CHLORHEXIDINE GLUCONATE 0.12 % MM MWSH
15 mL | Freq: Two times a day (BID) | 0 refills | Status: DC
Start: 2018-10-29 — End: 2018-10-31
  Administered 2018-10-30 – 2018-10-31 (×4): 15 mL

## 2018-10-29 MED ORDER — IPRATROPIUM BROMIDE 0.02 % IN SOLN
0.5 mg | Freq: Four times a day (QID) | RESPIRATORY_TRACT | 0 refills | Status: DC | PRN
Start: 2018-10-29 — End: 2018-10-30
  Administered 2018-10-29: 19:00:00 0.5 mg via RESPIRATORY_TRACT

## 2018-10-29 MED ORDER — ENOXAPARIN 60 MG/0.6 ML SC SYRG
60 mg | Freq: Two times a day (BID) | SUBCUTANEOUS | 0 refills | Status: DC
Start: 2018-10-29 — End: 2018-11-04
  Administered 2018-10-30 – 2018-11-04 (×11): 60 mg via SUBCUTANEOUS

## 2018-10-29 MED ORDER — VANCOMYCIN IN DEXTROSE 5 % 750 MG/150 ML IV PGBK
750 mg | Freq: Two times a day (BID) | INTRAVENOUS | 0 refills | Status: DC
Start: 2018-10-29 — End: 2018-10-30

## 2018-10-29 MED ORDER — VANCOMYCIN PHARMACY TO MANAGE
1 | 0 refills | Status: DC
Start: 2018-10-29 — End: 2018-10-30

## 2018-10-29 MED ORDER — VANCOMYCIN 1,750 MG IVPB
1750 mg | Freq: Two times a day (BID) | INTRAVENOUS | 0 refills | Status: DC
Start: 2018-10-29 — End: 2018-10-30

## 2018-10-29 MED ORDER — ALBUTEROL SULFATE 2.5 MG/0.5 ML IN NEBU
2.5 mg | RESPIRATORY_TRACT | 0 refills | Status: DC | PRN
Start: 2018-10-29 — End: 2018-11-01
  Administered 2018-10-30 – 2018-11-01 (×12): 2.5 mg via RESPIRATORY_TRACT

## 2018-10-30 LAB — URINALYSIS MICROSCOPIC REFLEX TO CULTURE

## 2018-10-30 LAB — COMPREHENSIVE METABOLIC PANEL
Lab: 1.6 mg/dL — ABNORMAL HIGH (ref 0.3–1.2)
Lab: 1.7 mg/dL — ABNORMAL HIGH (ref 0.4–1.00)
Lab: 138 MMOL/L — ABNORMAL LOW (ref 137–147)
Lab: 25 MMOL/L (ref 21–30)
Lab: 3.3 g/dL — ABNORMAL LOW (ref 3.5–5.0)
Lab: 30 mL/min — ABNORMAL LOW (ref >60)
Lab: 37 mL/min — ABNORMAL LOW (ref >60)
Lab: 46 U/L — ABNORMAL HIGH (ref 7–56)
Lab: 5.4 MMOL/L — ABNORMAL HIGH (ref 3.5–5.1)
Lab: 6.8 g/dL (ref 6.0–8.0)
Lab: 71 U/L (ref 25–110)
Lab: 8.2 mg/dL — ABNORMAL LOW (ref 8.5–10.6)
Lab: 83 U/L — ABNORMAL HIGH (ref 7–40)
Lab: 9 10*3/uL (ref 3–12)
Lab: 133 MMOL/L — ABNORMAL LOW (ref 137–147)

## 2018-10-30 LAB — RVP VIRAL PANEL PCR

## 2018-10-30 LAB — URINALYSIS DIPSTICK
Lab: NEGATIVE U/L (ref 25–110)
Lab: NEGATIVE U/L (ref 7–40)
Lab: NEGATIVE g/dL — ABNORMAL LOW (ref 3.5–5.0)

## 2018-10-30 LAB — POC GLUCOSE: Lab: 122 mg/dL — ABNORMAL HIGH (ref 70–100)

## 2018-10-30 LAB — BASIC METABOLIC PANEL
Lab: 104 MMOL/L (ref 98–110)
Lab: 139 MMOL/L — ABNORMAL LOW (ref 137–147)
Lab: 4.5 MMOL/L (ref 3.5–5.1)

## 2018-10-30 LAB — URINALYSIS DIPSTICK REFLEX TO CULTURE
Lab: NEGATIVE
Lab: NEGATIVE
Lab: NEGATIVE
Lab: NEGATIVE
Lab: NEGATIVE

## 2018-10-30 LAB — CBC AND DIFF: Lab: 7.3 10*3/uL — ABNORMAL LOW (ref 4.5–11.0)

## 2018-10-30 LAB — UREA NITROGEN-URINE RANDOM: Lab: 161 mg/dL

## 2018-10-30 LAB — MAGNESIUM
Lab: 1.7 mg/dL (ref 1.6–2.6)
Lab: 1.6 mg/dL — ABNORMAL LOW (ref >60)
Lab: 2 mg/dL — ABNORMAL LOW (ref 1.6–2.6)

## 2018-10-30 LAB — BLOOD GASES, ARTERIAL: Lab: 7.3 mmHg — ABNORMAL LOW (ref >40)

## 2018-10-30 LAB — TROPONIN-I
Lab: 0 ng/mL — ABNORMAL HIGH (ref 0.0–0.05)
Lab: 0.4 ng/mL — ABNORMAL HIGH (ref 0.0–0.05)
Lab: 0.3 ng/mL — ABNORMAL HIGH (ref 0.0–0.05)

## 2018-10-30 LAB — URINALYSIS, MICROSCOPIC

## 2018-10-30 LAB — PHOSPHORUS
Lab: 5.3 mg/dL — ABNORMAL HIGH (ref 2.0–4.5)
Lab: 4.9 mg/dL — ABNORMAL HIGH (ref >60)
Lab: 5.6 mg/dL — ABNORMAL HIGH (ref 2.0–4.5)

## 2018-10-30 LAB — BNP (B-TYPE NATRIURETIC PEPTI): Lab: 555 pg/mL — ABNORMAL HIGH (ref 0–100)

## 2018-10-30 LAB — TRIGLYCERIDE: Lab: 162 mg/dL — ABNORMAL HIGH (ref <150)

## 2018-10-30 LAB — PROCALCITONIN: Lab: 0.3 ng/mL — ABNORMAL HIGH (ref <0.11)

## 2018-10-30 LAB — CBC: Lab: 5.6 10*3/uL (ref 4.5–11.0)

## 2018-10-30 LAB — CREATININE-URINE RANDOM: Lab: 41 mg/dL

## 2018-10-30 MED ORDER — AMLODIPINE 5 MG PO TAB
5 mg | Freq: Every day | ORAL | 0 refills | Status: DC
Start: 2018-10-30 — End: 2018-11-01
  Administered 2018-10-30 – 2018-10-31 (×2): 5 mg via ORAL

## 2018-10-30 MED ORDER — SENNOSIDES-DOCUSATE SODIUM 8.6-50 MG PO TAB
1 | Freq: Two times a day (BID) | ORAL | 0 refills | Status: DC
Start: 2018-10-30 — End: 2018-10-31
  Administered 2018-10-30 – 2018-10-31 (×2): 1 via ORAL

## 2018-10-30 MED ORDER — DEXMEDETOMIDINE IV DRIP (STD CONC)
0.2-1 ug/kg/h | INTRAVENOUS | 0 refills | Status: DC
Start: 2018-10-30 — End: 2018-10-31
  Administered 2018-10-30: 19:00:00 0.6 ug/kg/h via INTRAVENOUS
  Administered 2018-10-30 (×2): 0.4 ug/kg/h via INTRAVENOUS
  Administered 2018-10-30 – 2018-10-31 (×8): 1 ug/kg/h via INTRAVENOUS
  Administered 2018-10-31 (×2): 0.8 ug/kg/h via INTRAVENOUS
  Administered 2018-10-31 (×4): 1 ug/kg/h via INTRAVENOUS

## 2018-10-30 MED ORDER — PERFLUTREN LIPID MICROSPHERES 1.1 MG/ML IV SUSP
1-20 mL | Freq: Once | INTRAVENOUS | 0 refills | Status: CP | PRN
Start: 2018-10-30 — End: ?
  Administered 2018-10-30: 15:00:00 2 mL via INTRAVENOUS

## 2018-10-30 MED ORDER — MAGNESIUM SULFATE IN D5W 1 GRAM/100 ML IV PGBK
1 g | INTRAVENOUS | 0 refills | Status: CP
Start: 2018-10-30 — End: ?
  Administered 2018-10-30 (×2): 1 g via INTRAVENOUS

## 2018-10-30 MED ORDER — VANCOMYCIN RANDOM DOSING
1 | INTRAVENOUS | 0 refills | Status: DC
Start: 2018-10-30 — End: 2018-10-30

## 2018-10-31 DIAGNOSIS — D638 Anemia in other chronic diseases classified elsewhere: Secondary | ICD-10-CM

## 2018-10-31 LAB — BASIC METABOLIC PANEL
Lab: 140 MMOL/L — ABNORMAL HIGH (ref 137–147)
Lab: 4.2 MMOL/L — ABNORMAL HIGH (ref 3.5–5.1)

## 2018-10-31 LAB — GRAM STAIN

## 2018-10-31 LAB — COMPREHENSIVE METABOLIC PANEL: Lab: 139 MMOL/L — ABNORMAL LOW (ref >60)

## 2018-10-31 LAB — CBC AND DIFF
Lab: 2.7 M/UL — ABNORMAL LOW (ref 4.0–5.0)
Lab: 7.5 K/UL — ABNORMAL LOW (ref 4.5–11.0)
Lab: 85 FL — ABNORMAL LOW (ref 80–100)

## 2018-10-31 LAB — MAGNESIUM: Lab: 1.8 mg/dL — ABNORMAL LOW (ref 1.6–2.6)

## 2018-10-31 LAB — TRIGLYCERIDE: Lab: 155 mg/dL — ABNORMAL HIGH (ref <150)

## 2018-10-31 LAB — POC GLUCOSE: Lab: 147 mg/dL — ABNORMAL HIGH (ref 70–100)

## 2018-10-31 LAB — BLOOD GASES, ARTERIAL: Lab: 7.3 M/UL — ABNORMAL LOW (ref 7.35–7.45)

## 2018-10-31 LAB — PHOSPHORUS: Lab: 6 mg/dL — ABNORMAL HIGH (ref 2.0–4.5)

## 2018-10-31 MED ORDER — FUROSEMIDE 10 MG/ML IJ SOLN
80 mg | Freq: Once | INTRAVENOUS | 0 refills | Status: CP
Start: 2018-10-31 — End: ?
  Administered 2018-10-31: 16:00:00 80 mg via INTRAVENOUS

## 2018-10-31 MED ORDER — DOCUSATE SODIUM 50 MG/5 ML PO LIQD
50 mg | Freq: Two times a day (BID) | ORAL | 0 refills | Status: DC
Start: 2018-10-31 — End: 2018-10-31
  Administered 2018-10-31: 16:00:00 50 mg via ORAL

## 2018-10-31 MED ORDER — PIPERACILLIN/TAZOBACTAM 4.5 G/NS IVPB (MB+)
4.5 g | INTRAVENOUS | 0 refills | Status: DC
Start: 2018-10-31 — End: 2018-11-01

## 2018-10-31 MED ORDER — VANCOMYCIN 2,000 MG IVPB
2000 mg | INTRAVENOUS | 0 refills | Status: DC
Start: 2018-10-31 — End: 2018-11-01
  Administered 2018-11-01 (×2): 2000 mg via INTRAVENOUS

## 2018-10-31 MED ORDER — DOCUSATE SODIUM 100 MG PO CAP
100 mg | Freq: Two times a day (BID) | ORAL | 0 refills | Status: DC
Start: 2018-10-31 — End: 2018-11-03
  Administered 2018-11-01 – 2018-11-03 (×5): 100 mg via ORAL

## 2018-10-31 MED ORDER — IPRATROPIUM BROMIDE 0.02 % IN SOLN
.5 mg | Freq: Four times a day (QID) | RESPIRATORY_TRACT | 0 refills | Status: DC | PRN
Start: 2018-10-31 — End: 2018-11-03
  Administered 2018-11-01 – 2018-11-03 (×11): 0.5 mg via RESPIRATORY_TRACT

## 2018-10-31 MED ORDER — ASPIRIN 81 MG PO TBEC
81 mg | Freq: Every day | ORAL | 0 refills | Status: DC
Start: 2018-10-31 — End: 2018-10-31

## 2018-10-31 MED ORDER — PIPERACILLIN/TAZOBACTAM 4.5 G/NS IVPB (MB+)
4.5 g | Freq: Once | INTRAVENOUS | 0 refills | Status: CP
Start: 2018-10-31 — End: ?
  Administered 2018-11-01 (×2): 4.5 g via INTRAVENOUS

## 2018-10-31 MED ORDER — BUSPIRONE 5 MG PO TAB
7.5 mg | Freq: Two times a day (BID) | ORAL | 0 refills | Status: DC
Start: 2018-10-31 — End: 2018-11-16
  Administered 2018-11-01 – 2018-11-15 (×30): 7.5 mg via ORAL

## 2018-10-31 MED ORDER — ALBUTEROL SULFATE 2.5 MG/0.5 ML IN NEBU
2.5 mg | Freq: Four times a day (QID) | RESPIRATORY_TRACT | 0 refills | Status: DC | PRN
Start: 2018-10-31 — End: 2018-11-03
  Administered 2018-11-01 – 2018-11-03 (×11): 2.5 mg via RESPIRATORY_TRACT

## 2018-10-31 MED ORDER — SENNOSIDES 8.8 MG/5 ML PO SYRP
8.8 mg | Freq: Two times a day (BID) | ORAL | 0 refills | Status: DC
Start: 2018-10-31 — End: 2018-11-03
  Administered 2018-10-31 – 2018-11-03 (×5): 8.8 mg via ORAL

## 2018-10-31 MED ORDER — PIPERACILLIN/TAZOBACTAM 4.5 G/NS (MB+)(EXTENDED INFUSION)
4.5 g | INTRAVENOUS | 0 refills | Status: DC
Start: 2018-10-31 — End: 2018-11-03
  Administered 2018-11-01 – 2018-11-03 (×18): 4.5 g via INTRAVENOUS

## 2018-10-31 MED ORDER — ACETAMINOPHEN 160 MG/5 ML PO SOLN
650 mg | ORAL | 0 refills | Status: AC | PRN
Start: 2018-10-31 — End: ?
  Administered 2018-11-01 – 2018-11-02 (×2): 650 mg via ORAL

## 2018-10-31 MED ORDER — VANCOMYCIN PHARMACY TO MANAGE
1 | 0 refills | Status: DC
Start: 2018-10-31 — End: 2018-11-01

## 2018-10-31 MED ORDER — PANTOPRAZOLE 40 MG PO TBEC
40 mg | Freq: Two times a day (BID) | ORAL | 0 refills | Status: DC
Start: 2018-10-31 — End: 2018-11-16
  Administered 2018-11-01 – 2018-11-15 (×27): 40 mg via ORAL

## 2018-10-31 MED ORDER — ASPIRIN 81 MG PO CHEW
81 mg | Freq: Every day | ORAL | 0 refills | Status: DC
Start: 2018-10-31 — End: 2018-11-16
  Administered 2018-10-31 – 2018-11-14 (×15): 81 mg via ORAL

## 2018-11-01 LAB — LACTIC ACID (BG - RAPID LACTATE): Lab: 1.2 MMOL/L (ref 0.5–2.0)

## 2018-11-01 LAB — URINALYSIS MICROSCOPIC REFLEX TO CULTURE

## 2018-11-01 LAB — URINALYSIS DIPSTICK REFLEX TO CULTURE
Lab: NEGATIVE
Lab: NEGATIVE
Lab: NEGATIVE
Lab: NEGATIVE
Lab: NEGATIVE

## 2018-11-01 LAB — CBC: Lab: 5.5 10*3/uL — ABNORMAL LOW (ref 4.5–11.0)

## 2018-11-01 LAB — PROCALCITONIN: Lab: 7 ng/mL — ABNORMAL HIGH (ref <0.11)

## 2018-11-01 LAB — MAGNESIUM: Lab: 1.7 mg/dL — ABNORMAL LOW (ref >60)

## 2018-11-01 LAB — CULTURE-RESP,LOWER W/SENSITIVITY: Lab: LOW g/dL (ref 6.0–8.0)

## 2018-11-01 LAB — POC GLUCOSE: Lab: 143 mg/dL — ABNORMAL HIGH (ref 70–100)

## 2018-11-01 LAB — BASIC METABOLIC PANEL: Lab: 141 MMOL/L — ABNORMAL LOW (ref 137–147)

## 2018-11-01 LAB — PHOSPHORUS: Lab: 3.8 mg/dL — ABNORMAL LOW (ref >60)

## 2018-11-01 MED ORDER — SPIRONOLACTONE 25 MG PO TAB
25 mg | Freq: Every day | ORAL | 0 refills | Status: DC
Start: 2018-11-01 — End: 2018-11-02
  Administered 2018-11-01: 16:00:00 25 mg via ORAL

## 2018-11-01 MED ORDER — HYDRALAZINE 20 MG/ML IJ SOLN
10 mg | INTRAVENOUS | 0 refills | Status: DC | PRN
Start: 2018-11-01 — End: 2018-11-01

## 2018-11-01 MED ORDER — FUROSEMIDE 10 MG/ML IJ SOLN
60 mg | Freq: Once | INTRAVENOUS | 0 refills | Status: CP
Start: 2018-11-01 — End: ?
  Administered 2018-11-01: 14:00:00 60 mg via INTRAVENOUS

## 2018-11-01 MED ORDER — ONDANSETRON HCL 4 MG PO TAB
4 mg | ORAL | 0 refills | Status: DC | PRN
Start: 2018-11-01 — End: 2018-11-16
  Administered 2018-11-03 – 2018-11-08 (×4): 4 mg via ORAL

## 2018-11-01 MED ORDER — ONDANSETRON HCL (PF) 4 MG/2 ML IJ SOLN
4 mg | INTRAVENOUS | 0 refills | Status: DC | PRN
Start: 2018-11-01 — End: 2018-11-16
  Administered 2018-11-02 – 2018-11-15 (×17): 4 mg via INTRAVENOUS

## 2018-11-01 MED ORDER — METRONIDAZOLE IN NACL (ISO-OS) 500 MG/100 ML IV PGBK
500 mg | INTRAVENOUS | 0 refills | Status: DC
Start: 2018-11-01 — End: 2018-11-02
  Administered 2018-11-01 – 2018-11-02 (×3): 500 mg via INTRAVENOUS

## 2018-11-01 MED ORDER — POTASSIUM CHLORIDE 20 MEQ PO TBTQ
60 meq | Freq: Once | ORAL | 0 refills | Status: CP
Start: 2018-11-01 — End: ?
  Administered 2018-11-01: 14:00:00 60 meq via ORAL

## 2018-11-01 MED ORDER — SIMETHICONE 80 MG PO CHEW
80 mg | ORAL | 0 refills | Status: DC | PRN
Start: 2018-11-01 — End: 2018-11-07
  Administered 2018-11-01 – 2018-11-07 (×6): 80 mg via ORAL

## 2018-11-01 MED ORDER — POTASSIUM CHLORIDE 20 MEQ PO TBTQ
40 meq | ORAL | 0 refills | Status: CP
Start: 2018-11-01 — End: ?
  Administered 2018-11-02 (×3): 40 meq via ORAL

## 2018-11-01 MED ORDER — METOCLOPRAMIDE HCL 5 MG PO TAB
5 mg | Freq: Two times a day (BID) | ORAL | 0 refills | Status: DC
Start: 2018-11-01 — End: 2018-11-06
  Administered 2018-11-01 – 2018-11-06 (×11): 5 mg via ORAL

## 2018-11-01 MED ORDER — MAGNESIUM SULFATE IN D5W 1 GRAM/100 ML IV PGBK
1 g | INTRAVENOUS | 0 refills | Status: CP
Start: 2018-11-01 — End: ?
  Administered 2018-11-01 (×2): 1 g via INTRAVENOUS

## 2018-11-01 MED ORDER — AMLODIPINE 10 MG PO TAB
10 mg | Freq: Every day | ORAL | 0 refills | Status: DC
Start: 2018-11-01 — End: 2018-11-16
  Administered 2018-11-01 – 2018-11-15 (×15): 10 mg via ORAL

## 2018-11-01 MED ORDER — LABETALOL 5 MG/ML IV SYRG
10-20 mg | INTRAVENOUS | 0 refills | Status: DC | PRN
Start: 2018-11-01 — End: 2018-11-03
  Administered 2018-11-01 – 2018-11-03 (×3): 10 mg via INTRAVENOUS

## 2018-11-01 MED ADMIN — SODIUM CHLORIDE 0.9 % IV SOLP [27838]: 250 mL | INTRAVENOUS | @ 04:00:00 | Stop: 2018-11-01 | NDC 00338004902

## 2018-11-02 ENCOUNTER — Encounter: Admit: 2018-11-02 | Discharge: 2018-11-02

## 2018-11-02 LAB — BASIC METABOLIC PANEL
Lab: 107 MMOL/L (ref 98–110)
Lab: 146 MMOL/L (ref 137–147)
Lab: 2.1 mg/dL — ABNORMAL HIGH (ref 0.4–1.00)
Lab: 27 MMOL/L (ref 21–30)
Lab: 3.2 MMOL/L — ABNORMAL LOW (ref 3.5–5.1)
Lab: 36 mg/dL — ABNORMAL HIGH (ref 7–25)
Lab: 12 (ref 3–12)
Lab: 145 MMOL/L — ABNORMAL LOW (ref 137–147)
Lab: 144 MMOL/L (ref 137–147)

## 2018-11-02 LAB — PHOSPHORUS: Lab: 3.5 mg/dL — ABNORMAL LOW (ref >60)

## 2018-11-02 LAB — C DIFFICILE BY PCR: Lab: NEGATIVE

## 2018-11-02 LAB — MAGNESIUM
Lab: 2 mg/dL (ref 1.6–2.6)
Lab: 1.9 mg/dL — ABNORMAL LOW (ref >60)

## 2018-11-02 LAB — CBC: Lab: 5.5 K/UL — ABNORMAL LOW (ref 4.5–11.0)

## 2018-11-02 MED ORDER — MAGNESIUM SULFATE IN D5W 1 GRAM/100 ML IV PGBK
1 g | INTRAVENOUS | 0 refills | Status: CP
Start: 2018-11-02 — End: ?
  Administered 2018-11-02 (×2): 1 g via INTRAVENOUS

## 2018-11-02 MED ORDER — CARVEDILOL 3.125 MG PO TAB
6.25 mg | Freq: Once | ORAL | 0 refills | Status: CP
Start: 2018-11-02 — End: ?
  Administered 2018-11-02: 16:00:00 6.25 mg via ORAL

## 2018-11-02 MED ORDER — POTASSIUM CHLORIDE 20 MEQ PO TBTQ
40 meq | Freq: Once | ORAL | 0 refills | Status: CP
Start: 2018-11-02 — End: ?
  Administered 2018-11-03: 01:00:00 40 meq via ORAL

## 2018-11-02 MED ORDER — POTASSIUM CHLORIDE 20 MEQ PO TBTQ
20 meq | Freq: Once | ORAL | 0 refills | Status: CP
Start: 2018-11-02 — End: ?
  Administered 2018-11-02: 15:00:00 20 meq via ORAL

## 2018-11-02 MED ORDER — CARVEDILOL 3.125 MG PO TAB
6.25 mg | Freq: Two times a day (BID) | ORAL | 0 refills | Status: DC
Start: 2018-11-02 — End: 2018-11-02
  Administered 2018-11-02: 15:00:00 6.25 mg via ORAL

## 2018-11-02 MED ORDER — ACETAMINOPHEN 325 MG PO TAB
650 mg | ORAL | 0 refills | Status: DC | PRN
Start: 2018-11-02 — End: 2018-11-16
  Administered 2018-11-02 – 2018-11-15 (×29): 650 mg via ORAL

## 2018-11-02 MED ORDER — CARVEDILOL 12.5 MG PO TAB
12.5 mg | Freq: Two times a day (BID) | ORAL | 0 refills | Status: DC
Start: 2018-11-02 — End: 2018-11-03
  Administered 2018-11-03 (×2): 12.5 mg via ORAL

## 2018-11-02 MED ORDER — SPIRONOLACTONE 50 MG PO TAB
50 mg | Freq: Every day | ORAL | 0 refills | Status: DC
Start: 2018-11-02 — End: 2018-11-16
  Administered 2018-11-02 – 2018-11-15 (×13): 50 mg via ORAL

## 2018-11-02 MED ORDER — FUROSEMIDE 10 MG/ML IJ SOLN
60 mg | Freq: Once | INTRAVENOUS | 0 refills | Status: CP
Start: 2018-11-02 — End: ?
  Administered 2018-11-02: 15:00:00 60 mg via INTRAVENOUS

## 2018-11-03 LAB — BASIC METABOLIC PANEL
Lab: 144 MMOL/L — ABNORMAL LOW (ref 137–147)
Lab: 142 MMOL/L — ABNORMAL LOW (ref 137–147)

## 2018-11-03 LAB — MAGNESIUM: Lab: 2 mg/dL — ABNORMAL LOW (ref >60)

## 2018-11-03 LAB — PHOSPHORUS: Lab: 2.4 mg/dL — ABNORMAL LOW (ref >60)

## 2018-11-03 LAB — CBC: Lab: 5.3 K/UL — ABNORMAL LOW (ref >60)

## 2018-11-03 MED ORDER — CARVEDILOL 25 MG PO TAB
25 mg | Freq: Two times a day (BID) | ORAL | 0 refills | Status: DC
Start: 2018-11-03 — End: 2018-11-16
  Administered 2018-11-04 – 2018-11-15 (×23): 25 mg via ORAL

## 2018-11-03 MED ORDER — SERTRALINE 25 MG PO TAB
25 mg | Freq: Every day | ORAL | 0 refills | Status: DC
Start: 2018-11-03 — End: 2018-11-05
  Administered 2018-11-03 – 2018-11-04 (×2): 25 mg via ORAL

## 2018-11-03 MED ORDER — FUROSEMIDE 10 MG/ML IJ SOLN
40 mg | Freq: Once | INTRAVENOUS | 0 refills | Status: CP
Start: 2018-11-03 — End: ?
  Administered 2018-11-03: 17:00:00 40 mg via INTRAVENOUS

## 2018-11-03 MED ORDER — HYDRALAZINE 25 MG PO TAB
25 mg | Freq: Three times a day (TID) | ORAL | 0 refills | Status: DC
Start: 2018-11-03 — End: 2018-11-03
  Administered 2018-11-03: 15:00:00 25 mg via ORAL

## 2018-11-03 MED ORDER — HYDROXYZINE HCL 25 MG PO TAB
25 mg | Freq: Three times a day (TID) | ORAL | 0 refills | Status: DC | PRN
Start: 2018-11-03 — End: 2018-11-16
  Administered 2018-11-03 – 2018-11-13 (×24): 25 mg via ORAL

## 2018-11-03 MED ORDER — MELATONIN 3 MG PO TAB
3 mg | Freq: Once | ORAL | 0 refills | Status: CP
Start: 2018-11-03 — End: ?
  Administered 2018-11-04: 02:00:00 3 mg via ORAL

## 2018-11-03 MED ORDER — LIDOCAINE 5 % TP PTMD
1 | Freq: Every day | TOPICAL | 0 refills | Status: DC
Start: 2018-11-03 — End: 2018-11-04
  Administered 2018-11-04: 02:00:00 1 via TOPICAL

## 2018-11-03 MED ORDER — SENNOSIDES 8.6 MG PO TAB
1 | Freq: Two times a day (BID) | ORAL | 0 refills | Status: DC
Start: 2018-11-03 — End: 2018-11-16
  Administered 2018-11-04 – 2018-11-15 (×23): 1 via ORAL

## 2018-11-03 MED ORDER — PATCH DOCUMENTATION - LIDOCAINE 5%
Freq: Two times a day (BID) | TRANSDERMAL | 0 refills | Status: DC
Start: 2018-11-03 — End: 2018-11-04

## 2018-11-03 MED ORDER — HYDRALAZINE 20 MG/ML IJ SOLN
20 mg | Freq: Once | INTRAVENOUS | 0 refills | Status: AC
Start: 2018-11-03 — End: ?

## 2018-11-03 MED ORDER — CARVEDILOL 12.5 MG PO TAB
12.5 mg | Freq: Once | ORAL | 0 refills | Status: CP
Start: 2018-11-03 — End: ?
  Administered 2018-11-03: 17:00:00 12.5 mg via ORAL

## 2018-11-03 MED ORDER — LABETALOL 5 MG/ML IV SYRG
10 mg | Freq: Once | INTRAVENOUS | 0 refills | Status: CP
Start: 2018-11-03 — End: ?
  Administered 2018-11-04: 01:00:00 10 mg via INTRAVENOUS

## 2018-11-03 MED ORDER — POTASSIUM CHLORIDE 20 MEQ PO TBTQ
40 meq | Freq: Once | ORAL | 0 refills | Status: CP
Start: 2018-11-03 — End: ?
  Administered 2018-11-03: 17:00:00 40 meq via ORAL

## 2018-11-03 MED ORDER — LABETALOL (NORMODYNE) BOLUS FOR CONT INFUSION
20 mg | Freq: Once | INTRAVENOUS | 0 refills | Status: DC
Start: 2018-11-03 — End: 2018-11-04

## 2018-11-04 LAB — MAGNESIUM: Lab: 1.9 mg/dL — ABNORMAL LOW (ref 1.6–2.6)

## 2018-11-04 LAB — CULTURE-BLOOD W/SENSITIVITY

## 2018-11-04 LAB — BASIC METABOLIC PANEL
Lab: 11 pg — ABNORMAL LOW (ref >60)
Lab: 142 MMOL/L — ABNORMAL LOW (ref >60)
Lab: 28 mg/dL — ABNORMAL HIGH (ref 7–25)
Lab: 142 MMOL/L (ref 137–147)
Lab: 3.5 MMOL/L (ref 3.5–5.1)

## 2018-11-04 LAB — CBC: Lab: 8.1 K/UL — ABNORMAL HIGH (ref >60)

## 2018-11-04 LAB — POC GLUCOSE
Lab: 141 mg/dL — ABNORMAL HIGH (ref 70–100)
Lab: 147 mg/dL — ABNORMAL HIGH (ref 70–100)
Lab: 135 mg/dL — ABNORMAL HIGH (ref >60)
Lab: 124 mg/dL — ABNORMAL HIGH (ref 70–100)

## 2018-11-04 LAB — PHOSPHORUS: Lab: 2.6 mg/dL — ABNORMAL LOW (ref >60)

## 2018-11-04 MED ORDER — HEPARIN (PORCINE) 10,000 UNIT/ML IJ SOLN (IMS)
7500 [IU] | SUBCUTANEOUS | 0 refills | Status: DC
Start: 2018-11-04 — End: 2018-11-06
  Administered 2018-11-04 – 2018-11-06 (×8): 7500 [IU] via SUBCUTANEOUS

## 2018-11-04 MED ORDER — PATCH DOCUMENTATION - LIDOCAINE 5%
Freq: Two times a day (BID) | TRANSDERMAL | 0 refills | Status: DC
Start: 2018-11-04 — End: 2018-11-16

## 2018-11-04 MED ORDER — LORAZEPAM 2 MG/ML IJ SOLN
.5-1 mg | Freq: Once | INTRAVENOUS | 0 refills | Status: CP
Start: 2018-11-04 — End: ?
  Administered 2018-11-04: 15:00:00 1 mg via INTRAVENOUS

## 2018-11-04 MED ORDER — LIDOCAINE 5 % TP PTMD
2 | Freq: Every day | TOPICAL | 0 refills | Status: DC
Start: 2018-11-04 — End: 2018-11-16
  Administered 2018-11-05 – 2018-11-07 (×3): 2 via TOPICAL

## 2018-11-04 MED ORDER — POTASSIUM CHLORIDE 20 MEQ PO TBTQ
60 meq | Freq: Once | ORAL | 0 refills | Status: CP
Start: 2018-11-04 — End: ?
  Administered 2018-11-04: 23:00:00 60 meq via ORAL

## 2018-11-04 MED ORDER — POTASSIUM CHLORIDE 20 MEQ PO TBTQ
60 meq | Freq: Once | ORAL | 0 refills | Status: CP
Start: 2018-11-04 — End: ?
  Administered 2018-11-04: 20:00:00 60 meq via ORAL

## 2018-11-04 MED ORDER — ALBUTEROL SULFATE 90 MCG/ACTUATION IN HFAA
1-2 | RESPIRATORY_TRACT | 0 refills | Status: DC | PRN
Start: 2018-11-04 — End: 2018-11-04

## 2018-11-04 MED ORDER — FUROSEMIDE 10 MG/ML IJ SOLN
40 mg | Freq: Once | INTRAVENOUS | 0 refills | Status: CP
Start: 2018-11-04 — End: ?
  Administered 2018-11-04: 15:00:00 40 mg via INTRAVENOUS

## 2018-11-04 MED ORDER — IPRATROPIUM BROMIDE 0.02 % IN SOLN
0.5 mg | RESPIRATORY_TRACT | 0 refills | Status: DC | PRN
Start: 2018-11-04 — End: 2018-11-06
  Administered 2018-11-04 – 2018-11-05 (×6): 0.5 mg via RESPIRATORY_TRACT

## 2018-11-04 MED ORDER — ALBUTEROL SULFATE 2.5 MG/0.5 ML IN NEBU
2.5 mg | RESPIRATORY_TRACT | 0 refills | Status: DC | PRN
Start: 2018-11-04 — End: 2018-11-06
  Administered 2018-11-04 – 2018-11-05 (×5): 2.5 mg via RESPIRATORY_TRACT

## 2018-11-05 LAB — CBC: Lab: 6.5 K/UL — ABNORMAL LOW (ref >60)

## 2018-11-05 LAB — BASIC METABOLIC PANEL
Lab: 143 MMOL/L — ABNORMAL LOW (ref >60)
Lab: 1.9 mg/dL — ABNORMAL HIGH (ref 0.4–1.00)
Lab: 108 MMOL/L (ref 98–110)
Lab: 141 MMOL/L (ref 137–147)
Lab: 147 mg/dL — ABNORMAL HIGH (ref 70–100)
Lab: 24 MMOL/L (ref 21–30)
Lab: 26 mL/min — ABNORMAL LOW (ref >60)
Lab: 3.9 MMOL/L (ref 3.5–5.1)
Lab: 32 mL/min — ABNORMAL LOW (ref >60)
Lab: 35 mg/dL — ABNORMAL HIGH (ref 7–25)
Lab: 8.7 mg/dL (ref 8.5–10.6)
Lab: 9 (ref 3–12)

## 2018-11-05 LAB — POC GLUCOSE
Lab: 130 mg/dL — ABNORMAL HIGH (ref 70–100)
Lab: 141 mg/dL — ABNORMAL HIGH (ref 70–100)
Lab: 156 mg/dL — ABNORMAL HIGH (ref 70–100)
Lab: 171 mg/dL — ABNORMAL HIGH (ref 70–100)

## 2018-11-05 LAB — PHOSPHORUS: Lab: 2.6 mg/dL — ABNORMAL LOW (ref >60)

## 2018-11-05 LAB — TROPONIN-I: Lab: 0 ng/mL (ref 0.0–0.05)

## 2018-11-05 LAB — MAGNESIUM: Lab: 1.9 mg/dL — ABNORMAL LOW (ref 1.6–2.6)

## 2018-11-05 MED ORDER — LORAZEPAM 0.5 MG PO TAB
.5 mg | Freq: Once | ORAL | 0 refills | Status: CP
Start: 2018-11-05 — End: ?
  Administered 2018-11-06: 02:00:00 0.5 mg via ORAL

## 2018-11-05 MED ORDER — IPRATROPIUM BROMIDE 0.02 % IN SOLN
0.5 mg | Freq: Four times a day (QID) | RESPIRATORY_TRACT | 0 refills | Status: DC | PRN
Start: 2018-11-05 — End: 2018-11-09
  Administered 2018-11-06 – 2018-11-08 (×7): 0.5 mg via RESPIRATORY_TRACT

## 2018-11-05 MED ORDER — LORAZEPAM 2 MG/ML IJ SOLN
.5-1 mg | Freq: Once | INTRAVENOUS | 0 refills | Status: CP
Start: 2018-11-05 — End: ?
  Administered 2018-11-05: 08:00:00 1 mg via INTRAVENOUS

## 2018-11-05 MED ORDER — ALBUTEROL SULFATE 2.5 MG/0.5 ML IN NEBU
2.5 mg | Freq: Four times a day (QID) | RESPIRATORY_TRACT | 0 refills | Status: DC | PRN
Start: 2018-11-05 — End: 2018-11-09
  Administered 2018-11-06 – 2018-11-08 (×7): 2.5 mg via RESPIRATORY_TRACT

## 2018-11-05 MED ORDER — NITROGLYCERIN 0.4 MG SL SUBL
.4 mg | SUBLINGUAL | 0 refills | Status: DC | PRN
Start: 2018-11-05 — End: 2018-11-16
  Administered 2018-11-05: 19:00:00 0.4 mg via SUBLINGUAL

## 2018-11-05 MED ORDER — FUROSEMIDE 10 MG/ML IJ SOLN
40 mg | Freq: Two times a day (BID) | INTRAVENOUS | 0 refills | Status: DC
Start: 2018-11-05 — End: 2018-11-12
  Administered 2018-11-05 – 2018-11-11 (×14): 40 mg via INTRAVENOUS

## 2018-11-05 MED ORDER — ALBUTEROL SULFATE 90 MCG/ACTUATION IN HFAA
2 | RESPIRATORY_TRACT | 0 refills | Status: DC | PRN
Start: 2018-11-05 — End: 2018-11-16

## 2018-11-05 MED ORDER — SUCRALFATE 1 GRAM PO TAB
1 g | Freq: Once | ORAL | 0 refills | Status: CP
Start: 2018-11-05 — End: ?
  Administered 2018-11-05: 19:00:00 1 g via ORAL

## 2018-11-05 MED ORDER — IRON SUCROSE 100 MG IRON/5 ML IV SOLN
200 mg | Freq: Every day | INTRAVENOUS | 0 refills | Status: CP
Start: 2018-11-05 — End: ?
  Administered 2018-11-05 – 2018-11-07 (×3): 200 mg via INTRAVENOUS

## 2018-11-05 MED ORDER — DIPHENHYDRAMINE HCL 50 MG/ML IJ SOLN
25 mg | INTRAVENOUS | 0 refills | Status: DC | PRN
Start: 2018-11-05 — End: 2018-11-08

## 2018-11-05 MED ORDER — FUROSEMIDE 10 MG/ML IJ SOLN
40 mg | Freq: Once | INTRAVENOUS | 0 refills | Status: DC
Start: 2018-11-05 — End: 2018-11-05

## 2018-11-05 MED ORDER — EPINEPHRINE HCL (PF) 1 MG/ML (1 ML) IJ SOLN
.3-.5 mg | INTRAMUSCULAR | 0 refills | Status: DC | PRN
Start: 2018-11-05 — End: 2018-11-08

## 2018-11-05 MED ORDER — LORAZEPAM 0.5 MG PO TAB
.5 mg | ORAL | 0 refills | Status: DC | PRN
Start: 2018-11-05 — End: 2018-11-05

## 2018-11-05 MED ORDER — SERTRALINE 50 MG PO TAB
50 mg | Freq: Every day | ORAL | 0 refills | Status: DC
Start: 2018-11-05 — End: 2018-11-16
  Administered 2018-11-05 – 2018-11-15 (×11): 50 mg via ORAL

## 2018-11-06 LAB — CULTURE-BLOOD W/SENSITIVITY

## 2018-11-06 LAB — BASIC METABOLIC PANEL
Lab: 141 MMOL/L — ABNORMAL HIGH (ref >60)
Lab: 141 MMOL/L — ABNORMAL HIGH (ref 137–147)

## 2018-11-06 LAB — RETICULOCYTE COUNT
Lab: 120 10*3/uL — ABNORMAL HIGH (ref 30–94)
Lab: 2.4 %
Lab: 5.4 % — ABNORMAL HIGH (ref 0.5–2.0)

## 2018-11-06 LAB — CBC
Lab: 5.3 K/UL — ABNORMAL HIGH (ref 4.5–11.0)
Lab: 146 10*3/uL — ABNORMAL LOW (ref 150–400)
Lab: 15 % — ABNORMAL HIGH (ref 11–15)
Lab: 19 % — ABNORMAL LOW (ref 36–45)
Lab: 28 pg (ref 26–34)
Lab: 33 g/dL (ref 32.0–36.0)
Lab: 4.9 10*3/uL (ref 4.5–11.0)
Lab: 85 FL (ref 80–100)
Lab: 9 FL (ref 7–11)

## 2018-11-06 LAB — MAGNESIUM: Lab: 1.9 mg/dL — ABNORMAL LOW (ref 1.6–2.6)

## 2018-11-06 LAB — POC GLUCOSE
Lab: 149 mg/dL — ABNORMAL HIGH (ref 70–100)
Lab: 140 mg/dL — ABNORMAL HIGH (ref 70–100)
Lab: 160 mg/dL — ABNORMAL HIGH (ref 70–100)

## 2018-11-06 LAB — VITAMIN B12: Lab: 657 pg/mL — ABNORMAL LOW (ref 180–914)

## 2018-11-06 LAB — PHOSPHORUS: Lab: 3.3 mg/dL — ABNORMAL LOW (ref 2.0–4.5)

## 2018-11-06 LAB — FOLATE, SERUM: Lab: 7.1 ng/mL — ABNORMAL LOW (ref >3.9)

## 2018-11-06 MED ORDER — FOLIC ACID 1 MG PO TAB
1 mg | Freq: Every day | ORAL | 0 refills | Status: DC
Start: 2018-11-06 — End: 2018-11-16
  Administered 2018-11-06 – 2018-11-15 (×10): 1 mg via ORAL

## 2018-11-06 MED ORDER — SIMETHICONE 80 MG PO CHEW
80 mg | Freq: Four times a day (QID) | ORAL | 0 refills | Status: DC
Start: 2018-11-06 — End: 2018-11-16
  Administered 2018-11-06 – 2018-11-15 (×35): 80 mg via ORAL

## 2018-11-06 MED ORDER — METOCLOPRAMIDE HCL 10 MG PO TAB
5 mg | Freq: Three times a day (TID) | ORAL | 0 refills | Status: DC
Start: 2018-11-06 — End: 2018-11-08
  Administered 2018-11-06 – 2018-11-08 (×6): 5 mg via ORAL

## 2018-11-06 MED ORDER — SODIUM CHLORIDE 0.9 % IV SOLP
INTRAVENOUS | 0 refills | Status: CN
Start: 2018-11-06 — End: ?

## 2018-11-06 MED ORDER — POTASSIUM CHLORIDE 20 MEQ PO TBTQ
40 meq | Freq: Once | ORAL | 0 refills | Status: CP
Start: 2018-11-06 — End: ?
  Administered 2018-11-06: 23:00:00 40 meq via ORAL

## 2018-11-07 LAB — PHOSPHORUS: Lab: 3.3 mg/dL — ABNORMAL LOW (ref >60)

## 2018-11-07 LAB — CBC
Lab: 15 % — ABNORMAL HIGH (ref 11–15)
Lab: 152 10*3/uL (ref 150–400)
Lab: 2.4 M/UL — ABNORMAL LOW (ref 4.0–5.0)
Lab: 21 % — ABNORMAL LOW (ref 36–45)
Lab: 28 pg (ref 26–34)
Lab: 33 g/dL (ref 32.0–36.0)
Lab: 5.7 10*3/uL (ref 4.5–11.0)
Lab: 7.1 g/dL — ABNORMAL LOW (ref 12.0–15.0)
Lab: 8.8 FL (ref 7–11)
Lab: 86 FL (ref 80–100)
Lab: 5.2 K/UL — ABNORMAL HIGH (ref 4.5–11.0)
Lab: 15 % — ABNORMAL HIGH (ref 11–15)
Lab: 175 10*3/uL (ref 150–400)
Lab: 2.4 M/UL — ABNORMAL LOW (ref 4.0–5.0)
Lab: 21 % — ABNORMAL LOW (ref 36–45)
Lab: 28 pg (ref 26–34)
Lab: 32 g/dL (ref 32.0–36.0)
Lab: 4.5 10*3/uL (ref 4.5–11.0)
Lab: 7.1 g/dL — ABNORMAL LOW (ref 12.0–15.0)
Lab: 8.7 FL (ref 7–11)
Lab: 86 FL (ref 80–100)

## 2018-11-07 LAB — POC GLUCOSE: Lab: 126 mg/dL — ABNORMAL HIGH (ref 70–100)

## 2018-11-07 LAB — BASIC METABOLIC PANEL
Lab: 142 MMOL/L — ABNORMAL LOW (ref 137–147)
Lab: 141 MMOL/L (ref 137–147)

## 2018-11-07 LAB — MAGNESIUM: Lab: 1.8 mg/dL — ABNORMAL LOW (ref >60)

## 2018-11-07 MED ORDER — PEG-ELECTROLYTE SOLN 420 GRAM PO SOLR
4 L | ORAL | 0 refills | Status: DC
Start: 2018-11-07 — End: 2018-11-12
  Administered 2018-11-08: 4 L via ORAL

## 2018-11-07 MED ORDER — IRON SUCROSE 100 MG IRON/5 ML IV SOLN
200 mg | Freq: Once | INTRAVENOUS | 0 refills | Status: AC
Start: 2018-11-07 — End: ?

## 2018-11-07 MED ORDER — LORAZEPAM 2 MG/ML IJ SOLN
.5-1 mg | Freq: Once | INTRAVENOUS | 0 refills | Status: CP
Start: 2018-11-07 — End: ?
  Administered 2018-11-07: 13:00:00 1 mg via INTRAVENOUS

## 2018-11-07 MED ORDER — BISACODYL 5 MG PO TBEC
10 mg | Freq: Once | ORAL | 0 refills | Status: AC | PRN
Start: 2018-11-07 — End: ?

## 2018-11-07 MED ORDER — PEG-ELECTROLYTE SOLN 420 GRAM PO SOLR
2 L | ORAL | 0 refills | Status: DC | PRN
Start: 2018-11-07 — End: 2018-11-12

## 2018-11-08 ENCOUNTER — Encounter: Admit: 2018-11-08 | Discharge: 2018-11-08

## 2018-11-08 ENCOUNTER — Inpatient Hospital Stay: Admit: 2018-11-08 | Discharge: 2018-11-08

## 2018-11-08 DIAGNOSIS — I10 Essential (primary) hypertension: Secondary | ICD-10-CM

## 2018-11-08 DIAGNOSIS — N141 Nephropathy induced by other drugs, medicaments and biological substances: Secondary | ICD-10-CM

## 2018-11-08 DIAGNOSIS — E785 Hyperlipidemia, unspecified: Secondary | ICD-10-CM

## 2018-11-08 DIAGNOSIS — J449 Chronic obstructive pulmonary disease, unspecified: Secondary | ICD-10-CM

## 2018-11-08 DIAGNOSIS — E119 Type 2 diabetes mellitus without complications: Secondary | ICD-10-CM

## 2018-11-08 LAB — POC GLUCOSE
Lab: 124 mg/dL — ABNORMAL HIGH (ref 70–100)
Lab: 112 mg/dL — ABNORMAL HIGH (ref 70–100)

## 2018-11-08 LAB — BASIC METABOLIC PANEL
Lab: 142 MMOL/L — ABNORMAL LOW (ref 137–147)
Lab: 3.6 MMOL/L — ABNORMAL HIGH (ref 3.5–5.1)

## 2018-11-08 LAB — MAGNESIUM: Lab: 1.9 mg/dL — ABNORMAL LOW (ref 1.6–2.6)

## 2018-11-08 LAB — CBC: Lab: 4.4 K/UL — ABNORMAL LOW (ref 4.5–11.0)

## 2018-11-08 LAB — PHOSPHORUS: Lab: 4.3 mg/dL — ABNORMAL LOW (ref 2.0–4.5)

## 2018-11-08 LAB — C DIFFICILE BY PCR

## 2018-11-08 MED ORDER — LIDOCAINE (PF) 200 MG/10 ML (2 %) IJ SYRG
0 refills | Status: DC
Start: 2018-11-08 — End: 2018-11-08
  Administered 2018-11-08: 22:00:00 100 mg via INTRAVENOUS

## 2018-11-08 MED ORDER — FENTANYL CITRATE (PF) 50 MCG/ML IJ SOLN
50 ug | INTRAVENOUS | 0 refills | Status: CN | PRN
Start: 2018-11-08 — End: ?

## 2018-11-08 MED ORDER — PROPOFOL INJ 10 MG/ML IV VIAL
0 refills | Status: DC
Start: 2018-11-08 — End: 2018-11-08
  Administered 2018-11-08: 22:00:00 50 mg via INTRAVENOUS
  Administered 2018-11-08: 22:00:00 20 mg via INTRAVENOUS

## 2018-11-08 MED ORDER — ONDANSETRON HCL (PF) 4 MG/2 ML IJ SOLN
4 mg | Freq: Once | INTRAVENOUS | 0 refills | Status: CP
Start: 2018-11-08 — End: ?
  Administered 2018-11-08: 23:00:00 4 mg via INTRAVENOUS

## 2018-11-08 MED ORDER — DIPHENHYDRAMINE HCL 50 MG/ML IJ SOLN
25 mg | Freq: Once | INTRAVENOUS | 0 refills | Status: CN | PRN
Start: 2018-11-08 — End: ?

## 2018-11-08 MED ORDER — SODIUM CHLORIDE 0.9 % IV SOLP
1000 mL | INTRAVENOUS | 0 refills | Status: AC
Start: 2018-11-08 — End: ?

## 2018-11-08 MED ORDER — PROPOFOL 10 MG/ML IV EMUL 50 ML (INFUSION)(AM)(OR)
INTRAVENOUS | 0 refills | Status: DC
Start: 2018-11-08 — End: 2018-11-08
  Administered 2018-11-08: 22:00:00 140 ug/kg/min via INTRAVENOUS

## 2018-11-08 MED ORDER — ALBUTEROL SULFATE 2.5 MG/0.5 ML IN NEBU
2.5 mg | RESPIRATORY_TRACT | 0 refills | Status: DC | PRN
Start: 2018-11-08 — End: 2018-11-11

## 2018-11-08 MED ORDER — METOCLOPRAMIDE HCL 5 MG/ML IJ SOLN
10 mg | Freq: Once | INTRAVENOUS | 0 refills | Status: CN | PRN
Start: 2018-11-08 — End: ?

## 2018-11-08 MED ORDER — SODIUM CHLORIDE 0.9 % IV SOLP
0 refills | Status: DC
Start: 2018-11-08 — End: 2018-11-08
  Administered 2018-11-08: 22:00:00 via INTRAVENOUS

## 2018-11-08 MED ORDER — FENTANYL CITRATE (PF) 50 MCG/ML IJ SOLN
25 ug | INTRAVENOUS | 0 refills | Status: CN | PRN
Start: 2018-11-08 — End: ?

## 2018-11-08 MED ORDER — METOCLOPRAMIDE HCL 10 MG PO TAB
10 mg | Freq: Three times a day (TID) | ORAL | 0 refills | Status: DC
Start: 2018-11-08 — End: 2018-11-13
  Administered 2018-11-08 – 2018-11-13 (×14): 10 mg via ORAL

## 2018-11-08 MED ORDER — POTASSIUM CHLORIDE 20 MEQ PO TBTQ
40 meq | Freq: Once | ORAL | 0 refills | Status: CP
Start: 2018-11-08 — End: ?
  Administered 2018-11-08: 14:00:00 40 meq via ORAL

## 2018-11-08 MED ORDER — IPRATROPIUM BROMIDE 0.02 % IN SOLN
0.5 mg | RESPIRATORY_TRACT | 0 refills | Status: DC | PRN
Start: 2018-11-08 — End: 2018-11-11

## 2018-11-08 MED ORDER — PROMETHAZINE 25 MG/ML IJ SOLN
6.25 mg | INTRAVENOUS | 0 refills | Status: CN | PRN
Start: 2018-11-08 — End: ?

## 2018-11-09 ENCOUNTER — Encounter: Admit: 2018-11-09 | Discharge: 2018-11-09

## 2018-11-09 DIAGNOSIS — J449 Chronic obstructive pulmonary disease, unspecified: Secondary | ICD-10-CM

## 2018-11-09 DIAGNOSIS — I10 Essential (primary) hypertension: Secondary | ICD-10-CM

## 2018-11-09 DIAGNOSIS — E119 Type 2 diabetes mellitus without complications: Secondary | ICD-10-CM

## 2018-11-09 DIAGNOSIS — D638 Anemia in other chronic diseases classified elsewhere: Secondary | ICD-10-CM

## 2018-11-09 DIAGNOSIS — E785 Hyperlipidemia, unspecified: Secondary | ICD-10-CM

## 2018-11-09 DIAGNOSIS — N141 Nephropathy induced by other drugs, medicaments and biological substances: Secondary | ICD-10-CM

## 2018-11-09 LAB — CBC
Lab: 3.1 10*3/uL — ABNORMAL LOW (ref 4.5–11.0)
Lab: 16 % — ABNORMAL HIGH (ref 11–15)
Lab: 185 10*3/uL (ref 150–400)
Lab: 2.4 M/UL — ABNORMAL LOW (ref 4.0–5.0)
Lab: 20 % — ABNORMAL LOW (ref 36–45)
Lab: 28 pg (ref 26–34)
Lab: 3.2 10*3/uL — ABNORMAL LOW (ref 4.5–11.0)
Lab: 32 g/dL (ref 32.0–36.0)
Lab: 6.8 g/dL — ABNORMAL LOW (ref 12.0–15.0)
Lab: 8.6 FL (ref 7–11)
Lab: 86 FL (ref 80–100)

## 2018-11-09 LAB — POC GLUCOSE
Lab: 150 mg/dL — ABNORMAL HIGH (ref 70–100)
Lab: 161 mg/dL — ABNORMAL HIGH (ref 70–100)
Lab: 140 mg/dL — ABNORMAL HIGH (ref 70–100)
Lab: 114 mg/dL — ABNORMAL HIGH (ref 70–100)

## 2018-11-09 LAB — MAGNESIUM: Lab: 1.7 mg/dL — ABNORMAL LOW (ref 1.6–2.6)

## 2018-11-09 LAB — BASIC METABOLIC PANEL: Lab: 142 MMOL/L — ABNORMAL LOW (ref 137–147)

## 2018-11-09 LAB — PHOSPHORUS: Lab: 4.5 mg/dL — ABNORMAL LOW (ref 2.0–4.5)

## 2018-11-09 MED ORDER — MELATONIN 5 MG PO TAB
5 mg | Freq: Every evening | ORAL | 0 refills | Status: DC
Start: 2018-11-09 — End: 2018-11-16
  Administered 2018-11-10 – 2018-11-15 (×6): 5 mg via ORAL

## 2018-11-10 LAB — CELIAC SCREEN

## 2018-11-10 LAB — PHOSPHORUS: Lab: 4 mg/dL — ABNORMAL LOW (ref 2.0–4.5)

## 2018-11-10 LAB — CBC: Lab: 3.7 K/UL — ABNORMAL LOW (ref 4.5–11.0)

## 2018-11-10 LAB — BASIC METABOLIC PANEL: Lab: 3.7 MMOL/L — ABNORMAL HIGH (ref 3.5–5.1)

## 2018-11-10 LAB — POC GLUCOSE: Lab: 138 mg/dL — ABNORMAL HIGH (ref 70–100)

## 2018-11-11 LAB — HAPTOGLOBIN: Lab: <30 mg/dL (ref 16–200)

## 2018-11-11 LAB — BASIC METABOLIC PANEL
Lab: 108 MMOL/L — ABNORMAL LOW (ref 98–110)
Lab: 141 MMOL/L — ABNORMAL HIGH (ref 137–147)

## 2018-11-11 LAB — CBC
Lab: 28 pg — ABNORMAL HIGH (ref >60)
Lab: 8 FL — ABNORMAL LOW (ref >60)

## 2018-11-11 LAB — TISSUE TRANSGLUTAMINASE IGG: Lab: 1.9

## 2018-11-11 LAB — FIBRINOGEN: Lab: 222 mg/dL (ref 200–400)

## 2018-11-11 MED ORDER — POTASSIUM CHLORIDE 20 MEQ PO TBTQ
40 meq | Freq: Once | ORAL | 0 refills | Status: CP
Start: 2018-11-11 — End: ?
  Administered 2018-11-11: 23:00:00 40 meq via ORAL

## 2018-11-11 MED ORDER — MAGNESIUM SULFATE IN D5W 1 GRAM/100 ML IV PGBK
1 g | INTRAVENOUS | 0 refills | Status: CP
Start: 2018-11-11 — End: ?
  Administered 2018-11-12 (×2): 1 g via INTRAVENOUS

## 2018-11-11 MED ORDER — CYCLOBENZAPRINE 10 MG PO TAB
10 mg | Freq: Once | ORAL | 0 refills | Status: CP
Start: 2018-11-11 — End: ?
  Administered 2018-11-11: 18:00:00 10 mg via ORAL

## 2018-11-12 ENCOUNTER — Encounter: Admit: 2018-11-12 | Discharge: 2018-11-12

## 2018-11-12 LAB — CBC AND DIFF
Lab: 0 % (ref 0–2)
Lab: 0 10*3/uL (ref 0–0.20)
Lab: 0.1 10*3/uL (ref 0–0.45)
Lab: 0.2 10*3/uL (ref 0–0.80)
Lab: 0.6 10*3/uL — ABNORMAL LOW (ref 1.0–4.8)
Lab: 1.9 10*3/uL (ref 1.8–7.0)
Lab: 17 % — ABNORMAL HIGH (ref 11–15)
Lab: 199 10*3/uL (ref 150–400)
Lab: 2 % (ref 0–5)
Lab: 2.3 M/UL — ABNORMAL LOW (ref 4.0–5.0)
Lab: 2.7 10*3/uL — ABNORMAL LOW (ref 4.5–11.0)
Lab: 20 % — ABNORMAL LOW (ref 36–45)
Lab: 23 % — ABNORMAL LOW (ref 24–44)
Lab: 29 pg (ref 26–34)
Lab: 33 g/dL (ref 32.0–36.0)
Lab: 6 % (ref 4–12)
Lab: 69 % (ref 41–77)
Lab: 7 g/dL — ABNORMAL LOW (ref 12.0–15.0)
Lab: 8.4 FL (ref 7–11)

## 2018-11-12 LAB — BASIC METABOLIC PANEL
Lab: 10 mg/dL (ref 3–12)
Lab: 127 mg/dL — ABNORMAL HIGH (ref 70–100)
Lab: 140 MMOL/L (ref 137–147)
Lab: 2 mg/dL — ABNORMAL HIGH (ref 0.4–1.00)
Lab: 22 MMOL/L (ref 21–30)
Lab: 4.1 MMOL/L (ref 3.5–5.1)
Lab: 8.5 mg/dL (ref 8.5–10.6)

## 2018-11-12 LAB — CBC
Lab: 2.8 10*3/uL — ABNORMAL LOW (ref 4.5–11.0)
Lab: 21 % — ABNORMAL LOW (ref 36–45)

## 2018-11-12 LAB — PERIPHERAL SMEAR

## 2018-11-12 LAB — MAGNESIUM: Lab: 1.8 mg/dL (ref 1.6–2.6)

## 2018-11-12 MED ORDER — FUROSEMIDE 10 MG/ML IJ SOLN
40 mg | Freq: Every day | INTRAVENOUS | 0 refills | Status: DC
Start: 2018-11-12 — End: 2018-11-13
  Administered 2018-11-12 – 2018-11-13 (×2): 40 mg via INTRAVENOUS

## 2018-11-12 MED ORDER — IOHEXOL 350 MG IODINE/ML IV SOLN
100 mL | Freq: Once | INTRAVENOUS | 0 refills | Status: CP
Start: 2018-11-12 — End: ?
  Administered 2018-11-12: 19:00:00 100 mL via INTRAVENOUS

## 2018-11-12 MED ORDER — BREEZA FOR NEUTRAL ABDOMINAL/PELVIC IMAGING PO SOLN
1500 mL | Freq: Once | ORAL | 0 refills | Status: CP
Start: 2018-11-12 — End: ?

## 2018-11-12 MED ORDER — CYCLOBENZAPRINE 10 MG PO TAB
10 mg | Freq: Three times a day (TID) | ORAL | 0 refills | Status: DC | PRN
Start: 2018-11-12 — End: 2018-11-16
  Administered 2018-11-12 – 2018-11-15 (×9): 10 mg via ORAL

## 2018-11-12 MED ORDER — GLUCAGON HCL 1 MG IJ SOLR
1 [IU] | Freq: Once | SUBCUTANEOUS | 0 refills | Status: CP
Start: 2018-11-12 — End: ?
  Administered 2018-11-12: 18:00:00 1 mg via SUBCUTANEOUS

## 2018-11-12 MED ORDER — SODIUM CHLORIDE 0.9 % IJ SOLN
50 mL | Freq: Once | INTRAVENOUS | 0 refills | Status: CP
Start: 2018-11-12 — End: ?
  Administered 2018-11-12: 19:00:00 50 mL via INTRAVENOUS

## 2018-11-12 MED ORDER — LORAZEPAM 2 MG/ML IJ SOLN
.5-1 mg | Freq: Once | INTRAVENOUS | 0 refills | Status: CP
Start: 2018-11-12 — End: ?
  Administered 2018-11-12: 18:00:00 1 mg via INTRAVENOUS

## 2018-11-13 LAB — RETICULOCYTE COUNT
Lab: 1.4 %
Lab: 3 % — ABNORMAL HIGH (ref 0.5–2.0)
Lab: 70 K/UL (ref 30–94)

## 2018-11-13 LAB — CBC AND DIFF
Lab: 0.7 K/UL — ABNORMAL LOW (ref >60)
Lab: 2.6 K/UL — ABNORMAL LOW (ref 4.5–11.0)
Lab: 25 % — ABNORMAL LOW (ref >60)

## 2018-11-13 LAB — BASIC METABOLIC PANEL: Lab: 27 mg/dL — ABNORMAL HIGH (ref 7–25)

## 2018-11-13 LAB — ENDOMYSIAL AB, IGA W/REFLEX: Lab: NEGATIVE

## 2018-11-13 LAB — LDH-LACTATE DEHYDROGENASE: Lab: 442 U/L — ABNORMAL HIGH (ref 100–210)

## 2018-11-13 MED ORDER — FUROSEMIDE 40 MG PO TAB
40 mg | Freq: Every day | ORAL | 0 refills | Status: DC
Start: 2018-11-13 — End: 2018-11-14
  Administered 2018-11-14: 14:00:00 40 mg via ORAL

## 2018-11-13 MED ORDER — METOCLOPRAMIDE HCL 10 MG PO TAB
5 mg | Freq: Three times a day (TID) | ORAL | 0 refills | Status: DC
Start: 2018-11-13 — End: 2018-11-16
  Administered 2018-11-13 – 2018-11-15 (×7): 5 mg via ORAL

## 2018-11-13 MED ORDER — AMITRIPTYLINE(#)/GABAPENTIN/EMU OIL 4/4/10%IN HRT TOPICAL CRM
Freq: Three times a day (TID) | TOPICAL | 0 refills | Status: DC | PRN
Start: 2018-11-13 — End: 2018-11-16

## 2018-11-14 LAB — CBC AND DIFF
Lab: 207 K/UL — ABNORMAL LOW (ref 150–400)
Lab: 6.8 g/dL — ABNORMAL LOW (ref >60)

## 2018-11-14 LAB — BASIC METABOLIC PANEL
Lab: 141 MMOL/L — ABNORMAL LOW (ref 137–147)
Lab: 2.3 mg/dL — ABNORMAL HIGH (ref 0.4–1.00)

## 2018-11-14 LAB — ERYTHROPOIETIN: Lab: 14 mU/mL — ABNORMAL LOW (ref 3.7–29.5)

## 2018-11-14 MED ORDER — EPOETIN ALFA 10,000 UNIT/ML IJ SOLN
10000 [IU] | SUBCUTANEOUS | 0 refills | Status: DC
Start: 2018-11-14 — End: 2018-11-16
  Administered 2018-11-15: 15:00:00 10000 [IU] via SUBCUTANEOUS

## 2018-11-15 ENCOUNTER — Encounter: Admit: 2018-11-15 | Discharge: 2018-11-15

## 2018-11-15 LAB — BASIC METABOLIC PANEL
Lab: 8.3 mg/dL — ABNORMAL LOW (ref 8.5–10.6)
Lab: 140 MMOL/L — ABNORMAL LOW (ref 137–147)
Lab: 8 g/dL — ABNORMAL LOW (ref 3–12)

## 2018-11-15 LAB — CBC AND DIFF: Lab: 2.1 M/UL — ABNORMAL LOW (ref 4.0–5.0)

## 2018-11-15 LAB — MAGNESIUM: Lab: 1.6 mg/dL — ABNORMAL LOW (ref 1.6–2.6)

## 2018-11-15 MED ORDER — AMLODIPINE 10 MG PO TAB
10 mg | Freq: Every day | ORAL | 0 refills | Status: SS
Start: 2018-11-15 — End: 2019-04-18

## 2018-11-15 MED ORDER — SERTRALINE 50 MG PO TAB
50 mg | Freq: Every day | ORAL | 0 refills | Status: SS
Start: 2018-11-15 — End: 2019-04-18

## 2018-11-15 MED ORDER — SPIRONOLACTONE 50 MG PO TAB
50 mg | Freq: Every day | ORAL | 0 refills | 90.00000 days | Status: AC
Start: 2018-11-15 — End: ?

## 2018-11-15 MED ORDER — FUROSEMIDE 40 MG PO TAB
40 mg | ORAL_TABLET | ORAL | 0 refills | 90.00000 days | Status: AC | PRN
Start: 2018-11-15 — End: 2018-11-15

## 2018-11-15 MED ORDER — EPOETIN ALFA 10,000 UNIT/ML IJ SOLN
10000 [IU] | SUBCUTANEOUS | 0 refills | Status: SS
Start: 2018-11-15 — End: 2019-04-18

## 2018-11-15 MED ORDER — FOLIC ACID 1 MG PO TAB
1 mg | Freq: Every day | ORAL | 0 refills | Status: SS
Start: 2018-11-15 — End: 2019-04-18

## 2018-11-15 MED ORDER — CARVEDILOL 25 MG PO TAB
25 mg | Freq: Two times a day (BID) | ORAL | 0 refills | 90.00000 days | Status: AC
Start: 2018-11-15 — End: ?

## 2018-11-15 MED ORDER — HYDROXYZINE HCL 25 MG PO TAB
25 mg | Freq: Three times a day (TID) | ORAL | 0 refills | Status: SS | PRN
Start: 2018-11-15 — End: 2019-04-18

## 2018-11-15 MED ORDER — SIMETHICONE 80 MG PO CHEW
80 mg | ORAL_TABLET | ORAL | 0 refills | Status: SS | PRN
Start: 2018-11-15 — End: 2019-04-18

## 2018-11-15 MED ORDER — PANTOPRAZOLE 40 MG PO TBEC
40 mg | Freq: Two times a day (BID) | ORAL | 0 refills | 90.00000 days | Status: AC
Start: 2018-11-15 — End: ?

## 2018-11-15 MED ORDER — METOCLOPRAMIDE HCL 5 MG PO TAB
5 mg | Freq: Three times a day (TID) | ORAL | 0 refills | Status: SS
Start: 2018-11-15 — End: 2019-04-18

## 2018-11-15 MED ORDER — EPOETIN ALFA 10,000 UNIT/ML IJ SOLN
10000 [IU] | SUBCUTANEOUS | 0 refills | 15.00000 days | Status: AC
Start: 2018-11-15 — End: 2018-11-15

## 2018-11-15 MED ORDER — FUROSEMIDE 40 MG PO TAB
40 mg | ORAL_TABLET | Freq: Every morning | ORAL | 0 refills | 90.00000 days | Status: AC
Start: 2018-11-15 — End: ?

## 2018-11-16 ENCOUNTER — Emergency Department: Admit: 2018-10-15 | Discharge: 2018-10-16

## 2018-11-16 ENCOUNTER — Inpatient Hospital Stay: Admit: 2018-10-20 | Discharge: 2018-10-20

## 2018-11-16 ENCOUNTER — Inpatient Hospital Stay: Admit: 2018-10-30 | Discharge: 2018-10-30

## 2018-11-16 ENCOUNTER — Inpatient Hospital Stay: Admit: 2018-11-08 | Discharge: 2018-11-08

## 2018-11-16 ENCOUNTER — Inpatient Hospital Stay: Admit: 2018-10-22 | Discharge: 2018-10-22

## 2018-11-16 ENCOUNTER — Inpatient Hospital Stay: Admit: 2018-10-29 | Discharge: 2018-10-29 | Payer: MEDICARE

## 2018-11-16 ENCOUNTER — Inpatient Hospital Stay: Admit: 2018-10-31 | Discharge: 2018-10-31 | Payer: MEDICARE

## 2018-11-16 ENCOUNTER — Inpatient Hospital Stay: Admit: 2018-10-29 | Discharge: 2018-10-29

## 2018-11-16 ENCOUNTER — Ambulatory Visit
Admit: 2018-10-16 | Discharge: 2018-11-16 | Disposition: A | Discharge status: Skilled Nursing Facility | Payer: MEDICARE

## 2018-11-16 ENCOUNTER — Emergency Department: Admit: 2018-10-15 | Discharge: 2018-10-15

## 2018-11-16 ENCOUNTER — Inpatient Hospital Stay: Admit: 2018-10-18 | Discharge: 2018-10-18

## 2018-11-16 ENCOUNTER — Inpatient Hospital Stay: Admit: 2018-10-26 | Discharge: 2018-10-26

## 2018-11-16 ENCOUNTER — Inpatient Hospital Stay: Admit: 2018-11-05 | Discharge: 2018-11-05

## 2018-11-16 ENCOUNTER — Inpatient Hospital Stay: Admit: 2018-11-02 | Discharge: 2018-11-02

## 2018-11-16 ENCOUNTER — Inpatient Hospital Stay: Admit: 2018-10-28 | Discharge: 2018-10-28 | Payer: MEDICARE

## 2018-11-16 ENCOUNTER — Encounter: Admit: 2018-11-12 | Discharge: 2018-11-13

## 2018-11-16 ENCOUNTER — Inpatient Hospital Stay: Admit: 2018-10-19 | Discharge: 2018-10-19 | Payer: MEDICARE

## 2018-11-16 DIAGNOSIS — Z6841 Body Mass Index (BMI) 40.0 and over, adult: Secondary | ICD-10-CM

## 2018-11-16 DIAGNOSIS — N183 Chronic kidney disease, stage 3 (moderate): Secondary | ICD-10-CM

## 2018-11-16 DIAGNOSIS — Z7984 Long term (current) use of oral hypoglycemic drugs: Secondary | ICD-10-CM

## 2018-11-16 DIAGNOSIS — J9602 Acute respiratory failure with hypercapnia: ICD-10-CM

## 2018-11-16 DIAGNOSIS — D638 Anemia in other chronic diseases classified elsewhere: Secondary | ICD-10-CM

## 2018-11-16 DIAGNOSIS — J449 Chronic obstructive pulmonary disease, unspecified: Secondary | ICD-10-CM

## 2018-11-16 DIAGNOSIS — Z933 Colostomy status: Secondary | ICD-10-CM

## 2018-11-16 DIAGNOSIS — N141 Nephropathy induced by other drugs, medicaments and biological substances: Secondary | ICD-10-CM

## 2018-11-16 DIAGNOSIS — F410 Panic disorder [episodic paroxysmal anxiety] without agoraphobia: Secondary | ICD-10-CM

## 2018-11-16 DIAGNOSIS — E785 Hyperlipidemia, unspecified: Secondary | ICD-10-CM

## 2018-11-16 DIAGNOSIS — K219 Gastro-esophageal reflux disease without esophagitis: Secondary | ICD-10-CM

## 2018-11-16 DIAGNOSIS — R1310 Dysphagia, unspecified: Secondary | ICD-10-CM

## 2018-11-16 DIAGNOSIS — I130 Hypertensive heart and chronic kidney disease with heart failure and stage 1 through stage 4 chronic kidney disease, or unspecified chronic kidney disease: Secondary | ICD-10-CM

## 2018-11-16 DIAGNOSIS — K3184 Gastroparesis: ICD-10-CM

## 2018-11-16 DIAGNOSIS — E1122 Type 2 diabetes mellitus with diabetic chronic kidney disease: Secondary | ICD-10-CM

## 2018-11-16 DIAGNOSIS — N179 Acute kidney failure, unspecified: ICD-10-CM

## 2018-11-16 DIAGNOSIS — E8770 Fluid overload, unspecified: Secondary | ICD-10-CM

## 2018-11-16 DIAGNOSIS — D72819 Decreased white blood cell count, unspecified: Secondary | ICD-10-CM

## 2018-11-16 DIAGNOSIS — D123 Benign neoplasm of transverse colon: ICD-10-CM

## 2018-11-16 DIAGNOSIS — K435 Parastomal hernia without obstruction or  gangrene: ICD-10-CM

## 2018-11-16 DIAGNOSIS — R1084 Generalized abdominal pain: Secondary | ICD-10-CM

## 2018-11-16 DIAGNOSIS — Z9049 Acquired absence of other specified parts of digestive tract: Secondary | ICD-10-CM

## 2018-11-16 DIAGNOSIS — I5033 Acute on chronic diastolic (congestive) heart failure: Secondary | ICD-10-CM

## 2018-11-16 DIAGNOSIS — E1143 Type 2 diabetes mellitus with diabetic autonomic (poly)neuropathy: ICD-10-CM

## 2018-11-16 DIAGNOSIS — D500 Iron deficiency anemia secondary to blood loss (chronic): Secondary | ICD-10-CM

## 2018-11-16 DIAGNOSIS — D122 Benign neoplasm of ascending colon: Secondary | ICD-10-CM

## 2018-11-16 DIAGNOSIS — G9341 Metabolic encephalopathy: Secondary | ICD-10-CM

## 2018-11-16 DIAGNOSIS — Z87891 Personal history of nicotine dependence: Secondary | ICD-10-CM

## 2018-11-16 DIAGNOSIS — R4702 Dysphasia: Secondary | ICD-10-CM

## 2018-11-16 DIAGNOSIS — J441 Chronic obstructive pulmonary disease with (acute) exacerbation: Secondary | ICD-10-CM

## 2018-11-16 DIAGNOSIS — J9601 Acute respiratory failure with hypoxia: Secondary | ICD-10-CM

## 2018-11-16 DIAGNOSIS — T508X5A Adverse effect of diagnostic agents, initial encounter: Secondary | ICD-10-CM

## 2018-11-16 DIAGNOSIS — D124 Benign neoplasm of descending colon: ICD-10-CM

## 2018-11-16 DIAGNOSIS — F329 Major depressive disorder, single episode, unspecified: ICD-10-CM

## 2018-11-16 DIAGNOSIS — I161 Hypertensive emergency: Secondary | ICD-10-CM

## 2018-11-16 DIAGNOSIS — F411 Generalized anxiety disorder: Secondary | ICD-10-CM

## 2018-11-20 ENCOUNTER — Encounter: Admit: 2018-11-20 | Discharge: 2018-11-20

## 2018-11-21 ENCOUNTER — Encounter: Admit: 2018-11-21 | Discharge: 2018-11-21

## 2018-11-26 ENCOUNTER — Encounter: Admit: 2018-11-26 | Discharge: 2018-11-26

## 2019-04-17 ENCOUNTER — Encounter: Admit: 2019-04-17 | Discharge: 2019-04-17

## 2019-04-17 ENCOUNTER — Emergency Department: Admit: 2019-04-17 | Discharge: 2019-04-17 | Attending: Critical Care Medicine

## 2019-04-17 DIAGNOSIS — J449 Chronic obstructive pulmonary disease, unspecified: Secondary | ICD-10-CM

## 2019-04-17 DIAGNOSIS — K433 Parastomal hernia with obstruction, without gangrene: Secondary | ICD-10-CM

## 2019-04-17 DIAGNOSIS — E119 Type 2 diabetes mellitus without complications: Secondary | ICD-10-CM

## 2019-04-17 DIAGNOSIS — I469 Cardiac arrest, cause unspecified: Principal | ICD-10-CM

## 2019-04-17 DIAGNOSIS — E785 Hyperlipidemia, unspecified: Secondary | ICD-10-CM

## 2019-04-17 DIAGNOSIS — E875 Hyperkalemia: Secondary | ICD-10-CM

## 2019-04-17 DIAGNOSIS — I10 Essential (primary) hypertension: Secondary | ICD-10-CM

## 2019-04-17 DIAGNOSIS — N141 Nephropathy induced by other drugs, medicaments and biological substances: Secondary | ICD-10-CM

## 2019-04-17 LAB — POC BLOOD GAS ARTERIAL
Lab: 25 MMOL/L (ref 21–28)
Lab: 3 MMOL/L
Lab: 30 MMOL/L — ABNORMAL HIGH (ref 21–28)
Lab: 66 mmHg — ABNORMAL HIGH (ref 35–45)
Lab: 7.1 MMOL/L — CL (ref 7.35–7.45)
Lab: 7.2 — ABNORMAL LOW (ref 7.35–7.45)
Lab: 83 mmHg — ABNORMAL HIGH (ref 35–45)
Lab: 2 MMOL/L
Lab: 28 MMOL/L — ABNORMAL HIGH (ref 21–28)
Lab: 63 mmHg — ABNORMAL HIGH (ref 35–45)
Lab: 7.2 — ABNORMAL LOW (ref 7.35–7.45)
Lab: 26 MMOL/L (ref 21–28)
Lab: 53 mmHg — ABNORMAL HIGH (ref 35–45)
Lab: 7.3 — ABNORMAL LOW (ref 7.35–7.45)

## 2019-04-17 LAB — HEMOGLOBIN A1C: Lab: 5.2 % (ref 4.0–6.0)

## 2019-04-17 LAB — SODIUM-URINE RANDOM: Lab: 95 MMOL/L

## 2019-04-17 LAB — CBC AND DIFF: Lab: 12 10*3/uL — ABNORMAL HIGH (ref 4.5–11.0)

## 2019-04-17 LAB — CBC
Lab: 10 10*3/uL (ref 4.5–11.0)
Lab: 15 % (ref 11–15)
Lab: 2.9 M/UL — ABNORMAL LOW (ref 4.0–5.0)
Lab: 26 % — ABNORMAL LOW (ref 36–45)
Lab: 31 pg (ref 26–34)
Lab: 8.2 FL (ref 7–11)
Lab: 9.1 g/dL — ABNORMAL LOW (ref 12.0–15.0)

## 2019-04-17 LAB — COMPREHENSIVE METABOLIC PANEL
Lab: 1.1 mg/dL (ref 0.3–1.2)
Lab: 106 MMOL/L — ABNORMAL LOW (ref 98–110)
Lab: 11 K/UL — ABNORMAL HIGH (ref 3–12)
Lab: 13 MMOL/L — ABNORMAL LOW (ref 21–30)
Lab: 130 MMOL/L — ABNORMAL LOW (ref 137–147)
Lab: 134 mg/dL — ABNORMAL HIGH (ref 70–100)
Lab: 21 U/L (ref 7–56)
Lab: 3.7 mg/dL — ABNORMAL HIGH (ref 0.4–1.00)
Lab: 4.2 g/dL — ABNORMAL HIGH (ref 3.5–5.0)
Lab: 59 mg/dL — ABNORMAL HIGH (ref 7–25)
Lab: 7.4 MMOL/L — ABNORMAL HIGH (ref 3.5–5.1)
Lab: 7.6 g/dL — ABNORMAL LOW (ref 6.0–8.0)
Lab: 8.8 mg/dL — ABNORMAL HIGH (ref 8.5–10.6)
Lab: 1.5 mg/dL — ABNORMAL HIGH (ref 0.3–1.2)
Lab: 105 mg/dL — ABNORMAL HIGH (ref 70–100)
Lab: 108 MMOL/L (ref 98–110)
Lab: 12 mL/min — ABNORMAL LOW (ref >60)
Lab: 14 mL/min — ABNORMAL LOW (ref >60)
Lab: 144 MMOL/L (ref 137–147)
Lab: 3.4 g/dL — ABNORMAL LOW (ref 3.5–5.0)
Lab: 3.8 mg/dL — ABNORMAL HIGH (ref 0.4–1.00)
Lab: 6.3 g/dL (ref 6.0–8.0)
Lab: 6.9 MMOL/L — ABNORMAL HIGH (ref 3.5–5.1)
Lab: 63 mg/dL — ABNORMAL HIGH (ref 7–25)
Lab: 70 U/L — ABNORMAL HIGH (ref 7–40)
Lab: 8.9 mg/dL (ref 8.5–10.6)
Lab: 88 U/L (ref 25–110)

## 2019-04-17 LAB — POC HEMATOCRIT
Lab: 21 % — ABNORMAL LOW (ref 36–45)
Lab: 23 % — ABNORMAL LOW (ref 36–45)
Lab: 26 % — ABNORMAL LOW (ref 36–45)
Lab: 7.1 g/dL — ABNORMAL LOW (ref 12.0–15.0)
Lab: 7.8 g/dL — ABNORMAL LOW (ref 12.0–15.0)
Lab: 8.8 g/dL — ABNORMAL LOW (ref 12.0–15.0)
Lab: 28 % — ABNORMAL LOW (ref 36–45)
Lab: 9.5 g/dL — ABNORMAL LOW (ref 12.0–15.0)

## 2019-04-17 LAB — LACTIC ACID (BG - RAPID LACTATE)
Lab: 2.8 MMOL/L — ABNORMAL HIGH (ref 0.5–2.0)
Lab: 2.1 MMOL/L — ABNORMAL HIGH (ref 0.5–2.0)

## 2019-04-17 LAB — URINALYSIS MICROSCOPIC REFLEX TO CULTURE

## 2019-04-17 LAB — URINALYSIS DIPSTICK REFLEX TO CULTURE
Lab: NEGATIVE
Lab: POSITIVE — AB

## 2019-04-17 LAB — MAGNESIUM: Lab: 1.2 mg/dL — ABNORMAL LOW (ref 1.6–2.6)

## 2019-04-17 LAB — BLOOD GASES, PERIPHERAL VENOUS
Lab: 11 MMOL/L
Lab: 14 MMOL/L
Lab: 23 mmHg — ABNORMAL LOW (ref 33–48)
Lab: 24 % — ABNORMAL LOW (ref 55–71)
Lab: 7.1 K/UL — CL (ref 7.30–7.40)

## 2019-04-17 LAB — COVID-19 (SARS-COV-2) PCR

## 2019-04-17 LAB — PROCALCITONIN: Lab: 15 ng/mL — ABNORMAL HIGH (ref <0.11)

## 2019-04-17 LAB — D-DIMER: Lab: 213 ng{FEU}/mL — ABNORMAL HIGH (ref <500)

## 2019-04-17 LAB — POC TROPONIN: Lab: 0.4 ng/mL — ABNORMAL HIGH (ref 0.00–0.05)

## 2019-04-17 LAB — UREA NITROGEN-URINE RANDOM: Lab: 113 mg/dL

## 2019-04-17 LAB — CREATININE-URINE RANDOM: Lab: 20 mg/dL

## 2019-04-17 LAB — POC LACTATE
Lab: 1.6 MMOL/L (ref 0.5–2.0)
Lab: 2.1 MMOL/L — ABNORMAL HIGH (ref 0.5–2.0)

## 2019-04-17 LAB — POC GLUCOSE
Lab: 424 mg/dL — ABNORMAL HIGH (ref 70–100)
Lab: 233 mg/dL — ABNORMAL HIGH (ref 70–100)
Lab: 325 mg/dL — ABNORMAL HIGH (ref 70–100)
Lab: 113 mg/dL — ABNORMAL HIGH (ref >40)

## 2019-04-17 LAB — POC SODIUM
Lab: 136 MMOL/L — ABNORMAL LOW (ref 137–147)
Lab: 138 MMOL/L (ref 137–147)
Lab: 139 MMOL/L (ref 137–147)
Lab: 142 MMOL/L (ref 137–147)

## 2019-04-17 LAB — PROTIME INR (PT)
Lab: 1.4 mL/min — ABNORMAL HIGH (ref >60)
Lab: 1.5 g/dL — ABNORMAL HIGH (ref 0.8–1.2)

## 2019-04-17 LAB — LIPID PROFILE
Lab: 14 mg/dL
Lab: 21 mg/dL (ref <100)
Lab: 36 mg/dL — ABNORMAL LOW (ref >40)
Lab: 42 mg/dL
Lab: 72 mg/dL (ref <150)
Lab: 78 mg/dL (ref <200)

## 2019-04-17 LAB — POC IONIZED CALCIUM
Lab: 1.2 MMOL/L (ref 1.0–1.3)
Lab: 1.2 MMOL/L (ref 1.0–1.3)
Lab: 1.3 MMOL/L — ABNORMAL HIGH (ref 1.0–1.3)
Lab: 1.2 MMOL/L (ref 1.0–1.3)

## 2019-04-17 LAB — POC BLOOD GAS VEN
Lab: 21 mmHg — ABNORMAL LOW (ref 33–48)
Lab: 7 — CL (ref 7.30–7.40)
Lab: 81 mmHg — ABNORMAL HIGH (ref 36–50)

## 2019-04-17 LAB — TROPONIN-I: Lab: 0.5 ng/mL — ABNORMAL HIGH (ref 0.0–0.05)

## 2019-04-17 LAB — PTT (APTT)
Lab: 24 s (ref 24.0–36.5)
Lab: 30 s — ABNORMAL LOW (ref 24.0–36.5)

## 2019-04-17 LAB — POC POTASSIUM
Lab: 8.2 MMOL/L — ABNORMAL HIGH (ref 3.5–5.1)
Lab: 8.3 MMOL/L — ABNORMAL HIGH (ref 3.5–5.1)
Lab: 8.3 MMOL/L — ABNORMAL HIGH (ref >60)
Lab: 6.6 MMOL/L — ABNORMAL HIGH (ref 3.5–5.1)

## 2019-04-17 LAB — PHOSPHORUS: Lab: 5.9 mg/dL — ABNORMAL HIGH (ref 2.0–4.5)

## 2019-04-17 LAB — LIPASE: Lab: 40 U/L (ref 11–82)

## 2019-04-17 LAB — BNP POC ER: Lab: 250 pg/mL — ABNORMAL HIGH (ref 0–100)

## 2019-04-17 MED ORDER — VANCOMYCIN 2,500 MG IVPB
2500 mg | Freq: Once | INTRAVENOUS | 0 refills | Status: DC
Start: 2019-04-17 — End: 2019-04-17

## 2019-04-17 MED ORDER — LORAZEPAM 2 MG/ML IJ SOLN
2 mg | 0 refills | Status: DC | PRN
Start: 2019-04-17 — End: 2019-04-19

## 2019-04-17 MED ORDER — DEXTROSE 50 % IN WATER (D50W) IV SYRG
0 refills | Status: CP
Start: 2019-04-17 — End: ?
  Administered 2019-04-17 (×2): 50 mL via INTRAVENOUS

## 2019-04-17 MED ORDER — FENTANYL CITRATE (PF) 50 MCG/ML IJ SOLN
25-50 ug | INTRAVENOUS | 0 refills | Status: DC | PRN
Start: 2019-04-17 — End: 2019-04-17
  Administered 2019-04-17: 18:00:00 25 ug via INTRAVENOUS
  Administered 2019-04-17: 19:00:00 50 ug via INTRAVENOUS

## 2019-04-17 MED ORDER — CEFEPIME 2G/100ML NS IVPB (MB+)
2 g | Freq: Once | INTRAVENOUS | 0 refills | Status: CP
Start: 2019-04-17 — End: ?
  Administered 2019-04-17 (×2): 2 g via INTRAVENOUS

## 2019-04-17 MED ORDER — SODIUM CHLORIDE 0.9 % IV SOLP
300 mL | INTRAVENOUS | 0 refills | Status: CP | PRN
Start: 2019-04-17 — End: ?
  Administered 2019-04-17: 20:00:00 300 mL via INTRAVENOUS

## 2019-04-17 MED ORDER — HEPARIN (PORCINE) BOLUS FOR CONTINUOUS INF (VIAL)
4000 [IU] | Freq: Once | INTRAVENOUS | 0 refills | Status: CP
Start: 2019-04-17 — End: ?
  Administered 2019-04-17: 21:00:00 4000 [IU] via INTRAVENOUS

## 2019-04-17 MED ORDER — POLYETHYLENE GLYCOL 3350 17 GRAM PO PWPK
1 | Freq: Two times a day (BID) | ORAL | 0 refills | Status: DC
Start: 2019-04-17 — End: 2019-04-19
  Administered 2019-04-17 – 2019-04-19 (×5): 17 g via ORAL

## 2019-04-17 MED ORDER — METRONIDAZOLE IN NACL (ISO-OS) 500 MG/100 ML IV PGBK
500 mg | INTRAVENOUS | 0 refills | Status: DC
Start: 2019-04-17 — End: 2019-04-19
  Administered 2019-04-17 – 2019-04-19 (×6): 500 mg via INTRAVENOUS

## 2019-04-17 MED ORDER — ROCURONIUM 10 MG/ML IV SOLN
100 mg | Freq: Once | INTRAVENOUS | 0 refills | Status: CP
Start: 2019-04-17 — End: ?
  Administered 2019-04-18: 03:00:00 100 mg via INTRAVENOUS

## 2019-04-17 MED ORDER — CHLORHEXIDINE GLUCONATE 0.12 % MM MWSH
15 mL | Freq: Two times a day (BID) | 0 refills | Status: DC
Start: 2019-04-17 — End: 2019-04-19
  Administered 2019-04-17 – 2019-04-19 (×5): 15 mL

## 2019-04-17 MED ORDER — HEPARIN (PORCINE) 1,000 UNIT/ML IJ SOLN
20-40 [IU]/kg | INTRAVENOUS | 0 refills | Status: DC
Start: 2019-04-17 — End: 2019-04-19
  Administered 2019-04-18 (×2): 3500 [IU] via INTRAVENOUS
  Administered 2019-04-19: 05:00:00 7000 [IU] via INTRAVENOUS

## 2019-04-17 MED ORDER — VANCOMYCIN PHARMACY TO MANAGE
1 | 0 refills | Status: DC
Start: 2019-04-17 — End: 2019-04-19

## 2019-04-17 MED ORDER — SODIUM BICARBONATE 8.4 % (1 MEQ/ML) IV SYRG
0 refills | Status: CP
Start: 2019-04-17 — End: ?
  Administered 2019-04-17 (×2): 50 meq via INTRAVENOUS

## 2019-04-17 MED ORDER — ALBUTEROL SULFATE 2.5 MG/0.5 ML IN NEBU
2.5 mg | RESPIRATORY_TRACT | 0 refills | Status: DC | PRN
Start: 2019-04-17 — End: 2019-04-19

## 2019-04-17 MED ORDER — IPRATROPIUM BROMIDE 0.02 % IN SOLN
.5 mg | RESPIRATORY_TRACT | 0 refills | Status: DC | PRN
Start: 2019-04-17 — End: 2019-04-19

## 2019-04-17 MED ORDER — SENNOSIDES-DOCUSATE SODIUM 8.6-50 MG PO TAB
2 | Freq: Two times a day (BID) | ORAL | 0 refills | Status: DC
Start: 2019-04-17 — End: 2019-04-19
  Administered 2019-04-17 – 2019-04-19 (×6): 2 via ORAL

## 2019-04-17 MED ORDER — METHYLPREDNISOLONE SOD SUC(PF) 125 MG/2 ML IJ SOLR
62.5 mg | Freq: Once | INTRAVENOUS | 0 refills | Status: CP
Start: 2019-04-17 — End: ?
  Administered 2019-04-17: 14:00:00 62.5 mg via INTRAVENOUS

## 2019-04-17 MED ORDER — HEPARIN (PORCINE) BOLUS FOR CONTINUOUS INF (BAG)
4000 [IU] | Freq: Once | INTRAVENOUS | 0 refills | Status: DC
Start: 2019-04-17 — End: 2019-04-17

## 2019-04-17 MED ORDER — ATORVASTATIN 40 MG PO TAB
80 mg | Freq: Every evening | ORAL | 0 refills | Status: DC
Start: 2019-04-17 — End: 2019-04-17

## 2019-04-17 MED ORDER — PERFLUTREN LIPID MICROSPHERES 1.1 MG/ML IV SUSP
1-20 mL | Freq: Once | INTRAVENOUS | 0 refills | Status: AC | PRN
Start: 2019-04-17 — End: ?

## 2019-04-17 MED ORDER — CEFEPIME 2G/100ML NS IVPB (MB+)(EXTENDED INFUSION)
2 g | Freq: Two times a day (BID) | INTRAVENOUS | 0 refills | Status: DC
Start: 2019-04-17 — End: 2019-04-17

## 2019-04-17 MED ORDER — SODIUM CITRATE 4 % (3 ML) IK SYRG
1-6 mL | 0 refills | Status: DC | PRN
Start: 2019-04-17 — End: 2019-04-17

## 2019-04-17 MED ORDER — VANCOMYCIN 2,500 MG IVPB
2500 mg | Freq: Once | INTRAVENOUS | 0 refills | Status: CP
Start: 2019-04-17 — End: ?
  Administered 2019-04-17 (×2): 2500 mg via INTRAVENOUS

## 2019-04-17 MED ORDER — VANCOMYCIN (15-40KG) IVPB
10 mg/kg | Freq: Two times a day (BID) | INTRAVENOUS | 0 refills | Status: DC
Start: 2019-04-17 — End: 2019-04-17

## 2019-04-17 MED ORDER — DEXTRAN 70-HYPROMELLOSE (PF) 0.1-0.3 % OP DPET
1 [drp] | Freq: Four times a day (QID) | OPHTHALMIC | 0 refills | Status: DC
Start: 2019-04-17 — End: 2019-04-20
  Administered 2019-04-17 – 2019-04-19 (×7): 1 [drp] via OPHTHALMIC

## 2019-04-17 MED ORDER — VANCOMYCIN RANDOM DOSING
1 | INTRAVENOUS | 0 refills | Status: DC
Start: 2019-04-17 — End: 2019-04-19

## 2019-04-17 MED ORDER — ATORVASTATIN 20 MG PO TAB
20 mg | Freq: Every evening | ORAL | 0 refills | Status: DC
Start: 2019-04-17 — End: 2019-04-19
  Administered 2019-04-18 – 2019-04-19 (×2): 20 mg via ORAL

## 2019-04-17 MED ORDER — ATROPINE 0.1 MG/ML IJ SYRG
0 refills | Status: CP
Start: 2019-04-17 — End: ?
  Administered 2019-04-17: 16:00:00 0.5 mg via INTRAVENOUS
  Administered 2019-04-17: 16:00:00 1 mg via INTRAVENOUS
  Administered 2019-04-17: 16:00:00 0.5 mg via INTRAVENOUS

## 2019-04-17 MED ORDER — PROPOFOL 10 MG/ML IV EMUL
5-50 ug/kg/min | INTRAVENOUS | 0 refills | Status: DC
Start: 2019-04-17 — End: 2019-04-17

## 2019-04-17 MED ORDER — SODIUM CHLORIDE 0.9 % IV SOLP
200 mL | INTRAVENOUS | 0 refills | Status: DC | PRN
Start: 2019-04-17 — End: 2019-04-17

## 2019-04-17 MED ORDER — PROPOFOL 10 MG/ML IV EMUL
5-30 ug/kg/min | INTRAVENOUS | 0 refills | Status: DC
Start: 2019-04-17 — End: 2019-04-18
  Administered 2019-04-17 – 2019-04-18 (×2): 10 ug/kg/min via INTRAVENOUS

## 2019-04-17 MED ORDER — ALTEPLASE 2 MG IK SOLR
2 mg | 0 refills | Status: DC | PRN
Start: 2019-04-17 — End: 2019-04-17

## 2019-04-17 MED ORDER — MAGNESIUM SULFATE IN D5W 1 GRAM/100 ML IV PGBK
1 g | INTRAVENOUS | 0 refills | Status: CP
Start: 2019-04-17 — End: ?
  Administered 2019-04-17 (×2): 1 g via INTRAVENOUS

## 2019-04-17 MED ORDER — FENTANYL DRIP IN NS 1000MCG/100ML
25-100 ug/h | INTRAVENOUS | 0 refills | Status: DC
Start: 2019-04-17 — End: 2019-04-19
  Administered 2019-04-17: 22:00:00 25 ug/h via INTRAVENOUS

## 2019-04-17 MED ORDER — EPINEPHRINE 0.1 MG/ML IJ SYRG
0 refills | Status: CP
Start: 2019-04-17 — End: ?
  Administered 2019-04-17 (×6): 1 mg via INTRAVENOUS

## 2019-04-17 MED ORDER — ASPIRIN 81 MG PO CHEW
81 mg | Freq: Every day | ORAL | 0 refills | Status: DC
Start: 2019-04-17 — End: 2019-04-19
  Administered 2019-04-17 – 2019-04-19 (×3): 81 mg via ORAL

## 2019-04-17 MED ORDER — ASPIRIN 81 MG PO CHEW
324 mg | Freq: Once | ORAL | 0 refills | Status: DC
Start: 2019-04-17 — End: 2019-04-17

## 2019-04-17 MED ORDER — MIDAZOLAM 1 MG/ML IJ SOLN
1-2 mg | INTRAVENOUS | 0 refills | Status: DC | PRN
Start: 2019-04-17 — End: 2019-04-17
  Administered 2019-04-17 (×2): 2 mg via INTRAVENOUS

## 2019-04-17 MED ORDER — HEPARIN (PORCINE) IN 5 % DEX 20,000 UNIT/500 ML (40 UNIT/ML) IV SOLP
0-2000 [IU]/h | INTRAVENOUS | 0 refills | Status: DC
Start: 2019-04-17 — End: 2019-04-19
  Administered 2019-04-17: 21:00:00 1000 [IU]/h via INTRAVENOUS
  Administered 2019-04-18: 12:00:00 1200 [IU]/h via INTRAVENOUS
  Administered 2019-04-19: 02:00:00 1400 [IU]/h via INTRAVENOUS
  Administered 2019-04-19: 17:00:00 1600 [IU]/h via INTRAVENOUS

## 2019-04-17 MED ORDER — FOLIC ACID 1 MG PO TAB
1 mg | Freq: Every day | ORAL | 0 refills | Status: DC
Start: 2019-04-17 — End: 2019-04-19
  Administered 2019-04-17 – 2019-04-19 (×3): 1 mg via ORAL

## 2019-04-17 MED ORDER — CEFEPIME 2G/100ML NS IVPB (MB+)
2 g | INTRAVENOUS | 0 refills | Status: DC
Start: 2019-04-17 — End: 2019-04-19
  Administered 2019-04-18 (×2): 2 g via INTRAVENOUS

## 2019-04-17 MED ORDER — ALTEPLASE 2 MG IK SOLR
2 mg | 0 refills | Status: DC | PRN
Start: 2019-04-17 — End: 2019-04-19

## 2019-04-17 MED ORDER — CALCIUM GLUCONATE 100 MG/ML (10%) IV SOLN
1 g | Freq: Once | INTRAVENOUS | 0 refills | Status: CP
Start: 2019-04-17 — End: ?
  Administered 2019-04-17 (×4): 1 g via INTRAVENOUS

## 2019-04-17 MED ORDER — ALBUTEROL SULFATE 90 MCG/ACTUATION IN HFAA
2 | RESPIRATORY_TRACT | 0 refills | Status: DC | PRN
Start: 2019-04-17 — End: 2019-04-17

## 2019-04-17 MED ORDER — NALOXONE 0.4 MG/ML IJ SOLN
.08 mg | INTRAVENOUS | 0 refills | Status: DC | PRN
Start: 2019-04-17 — End: 2019-04-19

## 2019-04-17 MED ORDER — FUROSEMIDE IVPB (120 MG - 160 MG DOSES)
160 mg | Freq: Once | INTRAVENOUS | 0 refills | Status: DC
Start: 2019-04-17 — End: 2019-04-17

## 2019-04-17 MED ORDER — FUROSEMIDE IV DRIP SYR (MAX CONC)
10 mg/h | INTRAVENOUS | 0 refills | Status: DC
Start: 2019-04-17 — End: 2019-04-18
  Administered 2019-04-18: 01:00:00 10 mg/h via INTRAVENOUS

## 2019-04-17 MED ORDER — SODIUM CITRATE 4 % (3 ML) IK SYRG
1-6 mL | Freq: Every day | 0 refills | Status: DC | PRN
Start: 2019-04-17 — End: 2019-04-19
  Administered 2019-04-17: 20:00:00 2.8 mL
  Administered 2019-04-17 (×2): 1.4 mL

## 2019-04-17 MED ORDER — PANTOPRAZOLE 40 MG IV SOLR
40 mg | Freq: Every day | INTRAVENOUS | 0 refills | Status: DC
Start: 2019-04-17 — End: 2019-04-19
  Administered 2019-04-17 – 2019-04-19 (×3): 40 mg via INTRAVENOUS

## 2019-04-17 MED ORDER — FUROSEMIDE 10 MG/ML IJ SOLN
80 mg | Freq: Once | INTRAVENOUS | 0 refills | Status: CP
Start: 2019-04-17 — End: ?
  Administered 2019-04-17: 21:00:00 80 mg via INTRAVENOUS

## 2019-04-17 MED ORDER — SODIUM BICARBONATE 8.4 % (1 MEQ/ML) IV SYRG
0 refills | Status: CP
Start: 2019-04-17 — End: ?
  Administered 2019-04-17 (×9): 50 meq via INTRAVENOUS

## 2019-04-17 MED ORDER — ATORVASTATIN 20 MG PO TAB
20 mg | Freq: Every evening | ORAL | 0 refills | Status: DC
Start: 2019-04-17 — End: 2019-04-17

## 2019-04-17 MED ORDER — HEPARIN (PORCINE) IN 5 % DEX 20,000 UNIT/500 ML (40 UNIT/ML) IV SOLP
0-2000 [IU]/h | INTRAVENOUS | 0 refills | Status: DC
Start: 2019-04-17 — End: 2019-04-17

## 2019-04-17 MED ORDER — SODIUM CHLORIDE 0.9 % IV SOLP
1000 mL | INTRAVENOUS | 0 refills | Status: DC | PRN
Start: 2019-04-17 — End: 2019-04-17

## 2019-04-17 MED ORDER — HEPARIN (PORCINE) BOLUS FOR CONTINUOUS INF (BAG)
20-40 [IU]/kg | INTRAVENOUS | 0 refills | Status: DC
Start: 2019-04-17 — End: 2019-04-17

## 2019-04-17 MED ADMIN — ALBUTEROL SULFATE 90 MCG/ACTUATION IN HFAA [40196]: 4 | RESPIRATORY_TRACT | @ 14:00:00 | Stop: 2019-04-17 | NDC 00173068220

## 2019-04-17 MED ADMIN — FUROSEMIDE 10 MG/ML IJ SOLN [3291]: 160 mg | INTRAVENOUS | @ 15:00:00 | Stop: 2019-04-17 | NDC 23155047332

## 2019-04-17 MED ADMIN — INSULIN REGULAR HUMAN(#) 1 UNIT/ML IJ SYRINGE [211626]: 10 [IU] | INTRAVENOUS | @ 16:00:00 | Stop: 2019-04-17 | NDC 54029038702

## 2019-04-17 MED ADMIN — ALBUTEROL SULFATE 2.5 MG/0.5 ML IN NEBU [93139]: 15 mg | RESPIRATORY_TRACT | @ 16:00:00 | Stop: 2019-04-17 | NDC 00487990130

## 2019-04-17 MED ADMIN — CALCIUM GLUCONATE 100 MG/ML (10%) IV SOLN [1312]: 1 g | INTRAVENOUS | @ 15:00:00 | Stop: 2019-04-17 | NDC 63323031110

## 2019-04-17 MED ADMIN — IPRATROPIUM BROMIDE 0.02 % IN SOLN [12580]: RESPIRATORY_TRACT | @ 16:00:00 | Stop: 2019-04-17 | NDC 00487980101
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
## 2019-04-17 NOTE — Progress Notes
Patient arrived to room # [id number]) via bed accompanied by RN. Patient transferred to the bed with assistance. Bedside safety checks completed. Initial patient assessment completed. Refer to flowsheet for details.    Admission skin assessment completed with: Marcene Brawn, RN    Pressure injury present on arrival?: Yes    1. Head/Face/Neck: No  2. Trunk/Back: No  3. Upper Extremities: No  4. Lower Extremities: No  5. Pelvic/Coccyx: Yes  6. Assessed for device associated injury? Yes  7. Malnutrition Screening Tool (Nursing Nutrition Assessment) Completed? Yes    See Doc Flowsheet for additional wound details.     INTERVENTIONS:   Barrier cream q8 and q2 turn

## 2019-04-17 NOTE — Progress Notes
Pharmacy to Manage Antibiotic Initiation Note  Alexandria Morgan is a 58 y.o. female being started on vancomycin     Creatinine   Date/Time Value Ref Range Status   05/09/2019 1208 3.89 (H) 0.4 - 1.00 MG/DL Final     Estimated CrCl: <38mL/min    Actual Weight: 175 kg (385 lb 12.9 oz)    Plan:   Vancomycin 2500mg  IV x 1 dose (GIven ~1230 7/1) then random dosing   Vancomycin level c AM labs 7/2 to assess initial dose/load   Pharmacy will continue to monitor and adjust therapy as needed.    Thank you,  Para Skeans, [facility]  05/02/2019

## 2019-04-17 NOTE — ED Notes
RT paged.

## 2019-04-17 NOTE — Procedures
1400  Arrived to patient`s bedside to administer dialysis treatment as ordered. 1410 Patient to CT scan per ICU team. 1507 Patient identity confirmed. Consent signed and treatment orders verified.  Cardiac, Oximetry, and BP monitors currently attached. Catheter ports soaked, lumens aspirate and flush without difficulty. 1519 Renal team at the bedside. Orders given to set the duration for 2 hours using a 1 K bath, and set the goal for 2-3 liters per Crit line.  Treatment started. Lines visible and secure. 1530 BFR decreased to 300 with elevated arterial pressure. 1600 Renal team notified of patient`s blood pressure decreased. Orders to clean the blood with minimal fluid removal of 0- 1 liters. 1517 Treatment ended. No fluid removed this dialysis treatment with low blood pressure during dialysis.  Ports soaked,lines flushed with saline, and blood returned to the patient. 1800 Report given to RN.
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
## 2019-04-17 NOTE — Progress Notes
[facility] notified of COVID19 negative test result. RN should notify attending provider of test result to determine isolation needs as appropriate. If COVID19 is no longer a concern, the isolation specific to COVID19 should be discontinued and the patient should be transferred to a non-COVID19 cohorted unit (if applicable) as soon as able.      Contact [facility] with any questions [phone]

## 2019-04-17 NOTE — ED Notes
X-ray at bedside.
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
## 2019-04-17 NOTE — Progress Notes
1830 Report taken from Unit [address] RN.    [place] care of patient. Bedside safety check complete. Monitors and gtts verified. ETT in place. Pt not following commands or withdrawing from pain at this time.

## 2019-04-17 NOTE — Progress Notes
MICU Critical Care Progress Note          Alexandria Morgan  Admission Date: 04/26/2019  LOS: 0 days                     Assessment/Plan:   Principal Problem:    Cardiac arrest [facility])      Patient is critically ill secondary to the above diagnoses, but primarily concerned for the following:    1.  Status post cardiac arrest asystole followed by PEA most likely secondary to metabolic derangements and perhaps complicated by hypoxemia  2.  Acute hypoxemic respiratory failure with hypercapnia due to cardiac arrest  3.  Acute renal failure with hyperkalemia  4.  Acute encephalopathy concern for anoxia versus seizure disorder  5.  Possible closed-loop small bowel obstruction    Exam:  Vitals and I/O's reviewed  Neuro-patient unresponsive to verbal or tactile stimulation.  No gag reflex, no pupillary reflex  HEENT: ET tube in place  Neck: No lymphadenopathy  Chest-decreased throughout but no wheezing  CV-regular rhythm and rate  Abd-morbidly obese with colostomy without acute changes  Ext- no cyanosis or clubbing.  Mild bilateral lower extremity edema  Skin- no rashes      I spent 90 minutes evaluating the patient including personally reviewing images, labs, progress and consult notes, discussing with nursing, and examining patient.  Plan of care includes the following:     -Discussed case at length with ED physician regarding context of code.  Patient had returned from CT scan.  Nursing noted flatline on telemetry monitor and went to evaluate patient and found patient unresponsive without pulse and initiated CPR.  Multiple rounds of ACLS performed with intermittent ROSC followed by PEA.  Total code time was between 45 minutes and an hour.  ??? Discussed with nephrology at the bedside and will urgently place dialysis catheter in order to correct hyperkalemia.  Temporizing measures done in ED with moderate improvement in potassium on arrival.  ??? Discussed with acute care surgery at bedside.  There is concerning changes on CT for closed-loop bowel obstruction.  Attempts to address this issue on last hospitalization already failed and certainly patient is not a good candidate for surgical intervention at this time.  I have a low suspicion for acute decompensation today is the direct consequence of recurrent small bowel obstruction.  However this is another complicating factor to her complex case and multiple comorbidities.  ??? Concern for anoxic injury as patient unresponsive without sedation and lack of brainstem reflexes.  CT head without acute bleed.  We will continue off of sedation and monitor.  Will obtain EEG with tremors of arms and facial muscles.  These did not abate with Versed.  ??? Probable non-STEMI.  Cardiology consulted.  Loaded with aspirin and now without evidence of acute CNS bleed could start heparin if recommended.  ??? Cefepime, Flagyl, and vancomycin for probable abdominal sepsis.  Pancultured and will follow results.  ??? Post cardiac arrest cooling has not been demonstrated to be of benefit in asystole or PEA arrest therefore, no plans for cooling at the current time.  ??? Discussed at bedside with palliative care and given overall poor prognosis for meaningful recovery will appreciate their assistance in coordinating communication and goals of care discussions with family.  ???Mechanical ventilation for optimization of oxygenation and ventilation.  ??? Once cleared for COVID-19 will need repeat echocardiogram.        Theodoro Kalata, MD
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
## 2019-04-18 ENCOUNTER — Encounter: Admit: 2019-04-18 | Discharge: 2019-04-18

## 2019-04-18 DIAGNOSIS — I469 Cardiac arrest, cause unspecified: Secondary | ICD-10-CM

## 2019-04-18 LAB — ASPERGILLUS (GALACTOMANNAN) AG: Lab: 0

## 2019-04-18 LAB — POC GLUCOSE: Lab: 172 mg/dL — ABNORMAL HIGH (ref 70–100)

## 2019-04-18 LAB — MAGNESIUM
Lab: 1.5 mg/dL — ABNORMAL LOW (ref 1.6–2.6)
Lab: 1.4 mg/dL — ABNORMAL LOW (ref 1.6–2.6)
Lab: 1.6 mg/dL — ABNORMAL HIGH (ref 1.6–2.6)
Lab: 1.7 mg/dL (ref 1.6–2.6)
Lab: 1.8 mg/dL — ABNORMAL HIGH (ref 1.6–2.6)
Lab: 1.7 mg/dL — ABNORMAL HIGH (ref 1.6–2.6)

## 2019-04-18 LAB — PHOSPHORUS
Lab: 3.7 mg/dL (ref 2.0–4.5)
Lab: 4.3 mg/dL — ABNORMAL LOW (ref 2.0–4.5)
Lab: 4.5 mg/dL — ABNORMAL LOW (ref >60)
Lab: 4.3 mg/dL (ref 2.0–4.5)
Lab: 4.8 mg/dL — ABNORMAL HIGH (ref 2.0–4.5)
Lab: 4.8 mg/dL — ABNORMAL HIGH (ref 2.0–4.5)

## 2019-04-18 LAB — BASIC METABOLIC PANEL
Lab: 10 mL/min — ABNORMAL LOW (ref >60)
Lab: 100 MMOL/L (ref >60)
Lab: 11 (ref 3–12)
Lab: 12 mL/min — ABNORMAL LOW (ref >60)
Lab: 12 mL/min — ABNORMAL LOW (ref >60)
Lab: 12 mg/dL (ref >60)
Lab: 128 mg/dL — ABNORMAL HIGH (ref 70–100)
Lab: 13 — ABNORMAL HIGH (ref 3–12)
Lab: 134 MMOL/L — ABNORMAL LOW (ref 137–147)
Lab: 135 MMOL/L — ABNORMAL LOW (ref 137–147)
Lab: 136 MMOL/L — ABNORMAL LOW (ref 137–147)
Lab: 137 MMOL/L (ref 137–147)
Lab: 138 MMOL/L (ref 137–147)
Lab: 14 mL/min — ABNORMAL LOW (ref >60)
Lab: 172 mg/dL — ABNORMAL HIGH (ref 70–100)
Lab: 25 MMOL/L (ref 21–30)
Lab: 25 MMOL/L (ref 21–30)
Lab: 25 MMOL/L (ref 21–30)
Lab: 25 MMOL/L — ABNORMAL HIGH (ref 21–30)
Lab: 3.8 mg/dL — ABNORMAL HIGH (ref 0.4–1.00)
Lab: 4.6 mg/dL — ABNORMAL HIGH (ref 0.4–1.00)
Lab: 46 mg/dL — ABNORMAL HIGH (ref 7–25)
Lab: 5.3 MMOL/L — ABNORMAL HIGH (ref >60)
Lab: 65 mg/dL — ABNORMAL HIGH (ref 7–25)
Lab: 8.5 mg/dL (ref 8.5–10.6)
Lab: 8.9 mg/dL (ref 8.5–10.6)

## 2019-04-18 LAB — PTT (APTT)
Lab: 37 s — ABNORMAL HIGH (ref 24.0–36.5)
Lab: 43 s — ABNORMAL HIGH (ref 24.0–36.5)
Lab: 38 s — ABNORMAL HIGH (ref 24.0–36.5)
Lab: 34 s (ref 24.0–36.5)

## 2019-04-18 LAB — BLOOD GASES, ARTERIAL
Lab: 187 mmHg — ABNORMAL HIGH (ref 80–100)
Lab: 2.5 MMOL/L
Lab: 26 MMOL/L (ref 21–28)
Lab: 34 mmHg — ABNORMAL LOW (ref 35–45)
Lab: 99 % — ABNORMAL HIGH (ref 95–99)
Lab: 179 mmHg — ABNORMAL HIGH (ref 80–100)
Lab: 2.2 MMOL/L
Lab: 26 MMOL/L (ref 21–28)
Lab: 36 mmHg (ref 35–45)
Lab: 7.4 K/UL — ABNORMAL HIGH (ref 7.35–7.45)
Lab: 7.4 — ABNORMAL HIGH (ref 7.35–7.45)
Lab: 99 % — ABNORMAL HIGH (ref 95–99)

## 2019-04-18 LAB — VANCOMYCIN TIMED LEVEL
Lab: 17 ug/mL — ABNORMAL HIGH (ref 3.5–5.1)
Lab: 15 ug/mL

## 2019-04-18 LAB — GRAM STAIN

## 2019-04-18 LAB — COMPREHENSIVE METABOLIC PANEL: Lab: 137 MMOL/L — ABNORMAL LOW (ref >60)

## 2019-04-18 LAB — TROPONIN-I
Lab: 0.9 ng/mL — ABNORMAL HIGH (ref 0.0–0.05)
Lab: 1 ng/mL — ABNORMAL HIGH (ref 0.0–0.05)
Lab: 1.3 ng/mL — ABNORMAL HIGH (ref 0.0–0.05)

## 2019-04-18 LAB — PROTIME INR (PT): Lab: 1.5 mg/dL — ABNORMAL HIGH (ref >60)

## 2019-04-18 LAB — LACTIC ACID(LACTATE): Lab: 1.3 MMOL/L (ref 0.5–2.0)

## 2019-04-18 LAB — CBC AND DIFF: Lab: 10 K/UL — ABNORMAL LOW (ref >60)

## 2019-04-18 LAB — FUNGITELL: Lab: <31

## 2019-04-18 LAB — HEPATITIS B SURFACE AG

## 2019-04-18 MED ORDER — MAGNESIUM SULFATE IN D5W 1 GRAM/100 ML IV PGBK
1 g | Freq: Once | INTRAVENOUS | 0 refills | Status: CP
Start: 2019-04-18 — End: ?
  Administered 2019-04-18: 06:00:00 1 g via INTRAVENOUS

## 2019-04-18 MED ORDER — SODIUM CHLORIDE 0.9 % IV SOLP
200 mL | INTRAVENOUS | 0 refills | Status: DC | PRN
Start: 2019-04-18 — End: 2019-04-18

## 2019-04-18 MED ORDER — DEXTROSE 50 % IN WATER (D50W) IV SOLP
25 g | Freq: Once | INTRAVENOUS | 0 refills | Status: CP
Start: 2019-04-18 — End: ?
  Administered 2019-04-18: 14:00:00 50 mL via INTRAVENOUS

## 2019-04-18 MED ORDER — SODIUM CITRATE 4 % (3 ML) IK SYRG
1-6 mL | 0 refills | Status: DC | PRN
Start: 2019-04-18 — End: 2019-04-18

## 2019-04-18 MED ORDER — PERFLUTREN LIPID MICROSPHERES 1.1 MG/ML IV SUSP
1-20 mL | Freq: Once | INTRAVENOUS | 0 refills | Status: CP | PRN
Start: 2019-04-18 — End: ?
  Administered 2019-04-18: 16:00:00 3 mL via INTRAVENOUS

## 2019-04-18 MED ORDER — SODIUM CHLORIDE 0.9 % IV SOLP
300 mL | INTRAVENOUS | 0 refills | Status: DC | PRN
Start: 2019-04-18 — End: 2019-04-18

## 2019-04-18 MED ORDER — VANCOMYCIN 1,750 MG IVPB
1750 mg | Freq: Once | INTRAVENOUS | 0 refills | Status: CP
Start: 2019-04-18 — End: ?
  Administered 2019-04-18 (×2): 1750 mg via INTRAVENOUS

## 2019-04-18 MED ORDER — INSULIN REGULAR HUMAN(#) 1 UNIT/ML IJ SYRINGE
10 [IU] | Freq: Once | INTRAVENOUS | 0 refills | Status: CP
Start: 2019-04-18 — End: ?
  Administered 2019-04-18: 06:00:00 10 [IU] via INTRAVENOUS

## 2019-04-18 MED ORDER — HYDRALAZINE 20 MG/ML IJ SOLN
10 mg | INTRAVENOUS | 0 refills | Status: DC | PRN
Start: 2019-04-18 — End: 2019-04-19
  Administered 2019-04-19: 03:00:00 10 mg via INTRAVENOUS

## 2019-04-18 MED ORDER — SODIUM CHLORIDE 0.9 % IV SOLP
1000 mL | INTRAVENOUS | 0 refills | Status: DC | PRN
Start: 2019-04-18 — End: 2019-04-18

## 2019-04-18 MED ORDER — DEXTROSE 50 % IN WATER (D50W) IV SOLP
25 g | Freq: Once | INTRAVENOUS | 0 refills | Status: CP
Start: 2019-04-18 — End: ?
  Administered 2019-04-18: 06:00:00 50 mL via INTRAVENOUS

## 2019-04-18 MED ORDER — INSULIN REGULAR HUMAN(#) 1 UNIT/ML IJ SYRINGE
10 [IU] | Freq: Once | INTRAVENOUS | 0 refills | Status: CP
Start: 2019-04-18 — End: ?
  Administered 2019-04-18: 14:00:00 10 [IU] via INTRAVENOUS

## 2019-04-18 MED ORDER — SODIUM ZIRCONIUM CYCLOSILICATE 10 GRAM PO PWPK
10 g | Freq: Once | ORAL | 0 refills | Status: CP
Start: 2019-04-18 — End: ?
  Administered 2019-04-18: 19:00:00 10 g via ORAL

## 2019-04-18 NOTE — Progress Notes
PALLIATIVE CARE INPATIENT NOTE     Name: Alexandria Morgan            MRN: [id number]                DOB: [birth year]          Age: 58 y.o.  Admission Date: 05/13/2019             LOS: 1 day    ASSESSMENT     Alexandria Morgan is a 58 y.o. female with Alexandria Morgan is a 58 y.o. female with morbid obesity, hyperkalemia, diastolic heart failure, severe combined acute respiratory failure who underwent cardiac arrest and was intubated in the emergency department admitted to the ICU for further care.  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      Advance Care Planning:   Code Status: Full Code  Identified Health Care Decision Maker:  Burman Nieves -sister      Prognosis (Estimated):  Based on current function, current medical issues, goals of care, and prognosis models, my estimated prognosis is depned on goal and treatment plan CMO vs continued life support.  Potential Disposition: Too soon to determine      PLAN     Zoom meeting ID: [id number]  Psswd:  [place]  ???  Contact: [email or web address]  ???  ???  #Cardiac Arrest, multifactorial, life threatening: Received CPR and intubation in emergency room transfer to ICU complicated by extreme hyperkalemia, severe acidosis, acute hypoxic and acute hypercapnic respiratory failure, acute on chronic kidney failure.  Neuro has evaluated pt and is very certain there is extensive and irreversible brain injury 2/2 hypoxic ischemic injury accumulated during CPR  ???  #Concern for bowel obstruction involving previously present parastomal hernia  -discussed with Dr. Allyson Sabal surg attending 7/1.  Pt is critical ill and not candidate for surgery at this time would not be expected to survive anesthesia.  Also it is  Unclear if she actually has surgical obstruction at this time.  Plan now is to attempt medical stabilization and monitor.  No plans for surgery  ???  #Prolonged CPR, concern for anoxic injury, clinical concern for seizures post intubation  ??? #Code status  -pt has consistently elected full code during past hospitalizations based on her records  -Patient stated that she wished to receive full aggressive care at outside of this hospitalization  ???  #advanced directives/goals/etc  -Patient stated during past hospitalization she had advanced directive and DURABLE POWER OF ATTORNEY forms but I cannot find these forms in our medical record, have not been able to locate any of these documents at this time  ???  ???  #Family Communication  -7/2 family meeting via Zoom conducted on unit and at bedside    Discussion:   7/2: Utilizing [employer] we had a meeting with family, patient's sisters and brother present on [employer].  Including myself and APRN [facility] neurology team also joined by Zoom and ICU fellow was present with [place] at bedside.  ICU nurse also participated.  Prior to entering the room on the unit we explained to the family that the patient had sustained a protracted period of cardiac arrest where she received CPR.  Although a pulse was able to be regained it is becoming quite evident that the patient has sustained very severe brain injury that is characterized by neurology is catastrophic.  This degree of brain injury is going to be very extensive and irreversible.  We  explained that the patient has little to no brainstem reflexes.  We then entered the room and allow the family to visualize the patient and explained her current level of life support.  We explained that the ventilator is supporting the patient's respirations are not the ventilator the patient would die.  We also explained the patient is in renal failure making very little urine.  The family asked several good questions.  The family stated then that they needed some time to discuss amongst themselves and 1 of the family members wanted to come and visit the patient and she plans to come and visit the patient later today.  We did make a recommendation for transition to comfort focused care plan, we described what this look would look like in detail outlining how we would provide the patient medications for comfort to avoid suffering and would at that point withdraw life support.  The family again stated that they understood the situation but needed some time to think it through and discuss things and also wanted to come and visit the patient in person.    RECOMMENDATIONS:  ? Continue supportive care at this time allowing family some time to process the very difficult information that they have received on the meeting today  ? Palliative medicine will continue to follow and facilitate family communication given pandemic visitation restrictions      45 minutes spent on care greater than 50% that time is counseling and care coordination as outlined above.  SUBJECTIVE     Pt cannot deliver subjective due to intubated and severe brain injury.              [place] Symptom Assessment Scale (ESAS-r-CS)     04/18/2019   Symptom / Score                                                    0 = Best Possible  10 = Worst Possible    Opioid Risk Took Assessment:   No data recorded      OBJECTIVE     Blood pressure 118/46, pulse 83, temperature 37.4 ???C (99.3 ???F), height 173 cm (68.11), weight (!) 174.6 kg (385 lb), SpO2 100 %.  Physical Exam  Opens eyes but not purposefully, does not track  No response to stimuli  occasional breath over vent  Hemodynamically stable  Obese  No response to pain  Vent/ett in place        Current PPS%:       Lab Results:  CBC   Lab Results   Component Value Date/Time    WBC 10.7 04/18/2019 04:25 AM    HGB 8.1 (L) 04/18/2019 04:25 AM    PLTCT 87 (L) 04/18/2019 04:25 AM     Lab Results   Component Value Date/Time    NEUT 90 (H) 05-01-19 08:59 AM    ANC 9.96 (H) 04/18/2019 04:25 AM    ANC 11.12 (H) 05-01-19 08:59 AM      Chemistries   Lab Results   Component Value Date/Time    NA 135 (L) 04/18/2019 06:05 AM    K 6.0 (H) 04/18/2019 06:05 AM    BUN 56 (H) 04/18/2019 06:05 AM CR 3.87 (H) 04/18/2019 06:05 AM    GLU 176 (H) 04/18/2019 06:05 AM     Lab Results   Component Value  Date/Time    CA 8.5 04/18/2019 06:05 AM    PO4 4.3 04/18/2019 06:05 AM    ALBUMIN 3.3 (L) 04/18/2019 04:25 AM    TOTPROT 6.2 04/18/2019 04:25 AM    ALKPHOS 68 04/18/2019 04:25 AM    AST 58 (H) 04/18/2019 04:25 AM    ALT 33 04/18/2019 04:25 AM    TOTBILI 1.5 (H) 04/18/2019 04:25 AM    GFR 12 (L) 04/18/2019 06:05 AM    GFRAA 14 (L) 04/18/2019 06:05 AM        Other Pertinent Diagnostic Results:   EEG:  7/1:  Reviewed the first 2 hrs of VEEG   Background with burst suppression pattern with no reactivity.  Intermittent frequent cortical myoclonic activity is noticed throughout the recording. This is associated with generalized burst of theta range activity with some sharp features. These are time locked to the movements thus making it difficult to differentiate between movement artifact and epileptic activity.  Administration of rocuronium was attempted with blocking of movements but presence of bursts of 4-5 Hz sharp waves.   ???  Presence of burst suppression pattern within 24 - 48 hrs is indicative of poor prognosis after cardiac arrest.
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
## 2019-04-18 NOTE — Progress Notes
Daily Progress Note      Today's Date:  04/18/2019  Name:  Alexandria Morgan                       MRN:  [id number]   Admission Date: 04-30-2019 (LOS: 1 day)                     Assessment: Alexandria Morgan is a 58 y.o. 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    Principal Problem:    Cardiac arrest [facility])  Active Problems:    Leukocytosis    Acute respiratory failure with hypoxia and hypercapnia (HCC)    Encephalopathy    Seizure (HCC)    Myocardial injury    Acute on chronic heart failure with preserved ejection fraction (HFpEF) (HCC)    Elevated d-dimer    Acute abdominal pain    Acute kidney injury superimposed on CKD (HCC)    Hyperkalemia    Hypomagnesemia    Lactic acidosis    Hematuria    Cellulitis    Intra-abdominal infection      Plan:  Unfortunately, patient is not a surgical candidate due to multiple comorbidites and current unstable status.    Discussed with Dr. Allyson Sabal and Dr. Thurston Pounds.    Heron Sabins, MD  Pager [phone]  _____________________________________________________________________________    Subjective:  Patient is still unresponsive this AM. Pt is intubated. 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    Objective:  BP: (91-189)/(27-108)   Temp:  [37.1 ???C (98.8 ???F)-38.1 ???C (100.5 ???F)]   Pulse:  [29-145]   Respirations:  [0 PER MINUTE-61 PER MINUTE]   SpO2:  [19 %-100 %]     Lab Results   Component Value Date/Time    NA 135 (L) 04/18/2019 06:05 AM    K 6.0 (H) 04/18/2019 06:05 AM    CL 99 04/18/2019 06:05 AM    CO2 25 04/18/2019 06:05 AM    BUN 56 (H) 04/18/2019 06:05 AM    CR 3.87 (H) 04/18/2019 06:05 AM    MG 1.7 04/18/2019 06:05 AM    PO4 4.3 04/18/2019 06:05 AM      Lab Results   Component Value Date/Time    HGB 8.1 (L) 04/18/2019 04:25 AM    HCT 23.2 (L) 04/18/2019 04:25 AM    WBC 10.7 04/18/2019 04:25 AM    PLTCT 87 (L) 04/18/2019 04:25 AM    INR 1.5 (H) 04/18/2019 04:25 AM Lab Results   Component Value Date/Time    GLUPOC 113 (H) 04-30-2019 12:14 PM    GLUPOC 325 (H) 04-30-2019 10:54 AM    GLUPOC 233 (H) 30-Apr-2019 10:42 AM      Physical Exam   Neck:   Intubated   Abdominal: Soft. She exhibits no distension. There is no abdominal tenderness.   Colostomy in place. Mucosa pink and viable.   Neurological:   Patient is not alert and oriented.   Vitals reviewed.    Output by Drain (mL) 04/16/19 0701 - 04/16/19 1900 04/16/19 1901 - 2019/04/30 0700 2019/04/30 0701 - 30-Apr-2019 1900 04/30/2019 1901 - 04/18/19 0700 04/18/19 0701 - 04/18/19 0919   Oral Gastric Tube Apr 30, 2019 1057 18FR   0 200 100   Colostomy 08/28/18 0441 Lower Left Quadrant   0  ___________________________    Heron Sabins, MD   Service Pager: # [phone]

## 2019-04-18 NOTE — Progress Notes
Cardiology Daily Progress Note  04/18/2019    Alexandria Morgan            Admission Date:  05/17/2019                     Assessment/Plan:    Principal Problem:    Cardiac arrest [facility])  Active Problems:    Leukocytosis    Acute respiratory failure with hypoxia and hypercapnia (HCC)    Encephalopathy    Seizure (HCC)    Myocardial injury    Acute on chronic heart failure with preserved ejection fraction (HFpEF) (HCC)    Elevated d-dimer    Acute abdominal pain    Acute kidney injury superimposed on CKD (HCC)    Hyperkalemia    Hypomagnesemia    Lactic acidosis    Hematuria    Cellulitis    Intra-abdominal infection          1. Elevated troponin, mildly trending up, currently at 1.32, multifactorial,  less likely to be due to ACS.  The LV ejection fraction is about 50%.  She has coronary calcifications on CT of the chest and may indeed have underlying significant coronary disease but given her overall condition, she is not a candidate for further coronary testing.  We can trend her troponins and  IV heparin can be discontinued after 48 hours.    2.  Cardiac arrest, probably related to severe hyperkalemia      Dr. Sherrye Payor is the rounder for cardiology over the long weekend.  Please call if needed.  __________________________________________________________________________________    Subjective: On vent, unresponsive    Allergies:  Codeine; Darvocet [propoxyphene n-acetaminophen]; Dilaudid [hydromorphone]; Tetracycline; and Adhesive tape (rosins)    Medications:  Scheduled Meds:aspirin chewable tablet 81 mg, 81 mg, Oral, QDAY  atorvastatin (LIPITOR) tablet 20 mg, 20 mg, Oral, QHS  cefepime (MAXIPIME) 2 g in sodium chloride 0.9% (NS) 100 mL IVPB (MB+), 2 g, Intravenous, Q24H*  chlorhexidine gluconate (PERIDEX) 0.12 % solution 15 mL, 15 mL, SEE ADMIN INSTRUCTIONS, BID(8-20)  dextran 70/hypromellose (GENTEAL TEARS) ophthalmic solution 1 drop, 1 drop, Both Eyes, QID  folic acid (FOLVITE) tablet 1 mg, 1 mg, Oral, QDAY heparin (porcine) injection 3,500-7,000 Units, 20-40 Units/kg, Intravenous, As Prescribed  metroNIDAZOLE (FLAGYL) 500 mg IVPB 100 mL, 500 mg, Intravenous, Q8H*  pantoprazole (PROTONIX) injection 40 mg, 40 mg, Intravenous, QDAY  polyethylene glycol 3350 (MIRALAX) packet 17 g, 1 packet, Oral, BID  senna/docusate (SENOKOT-S) tablet 2 tablet, 2 tablet, Oral, BID  vancomycin, random dosing, 1 each, Intravenous, Random Dosing    Continuous Infusions:  ??? fentaNYL (SUBLIMAZE)  1000 mcg/100 mL NS IV drip (std conc)(premade) Stopped (04/30/2019 2350)   ??? heparin (porcine) 20,000 units/D5W 500 mL infusion (std conc)(premade) 1,200 Units/hr (04/18/19 0717)     PRN and Respiratory Meds:albuterol 0.5% Q4H PRN **AND** ipratropium bromide Q4H PRN, alteplase PRN (On Call from Rx), anticoagulant sodium citrate QDAY PRN, LORazepam  (ATIVAN)  injection PRN, naloxone PRN, [DISCONTINUED] vancomycin  (0-40 kg) IV Q12H* **AND** vancomycin, pharmacy to manage Per Pharmacy       Physical Exam:  Vital Signs: Last Filed In 24 Hours Vital Signs: 24 Hour Range   BP: 142/64 (07/02 1200)  Temp: 37.4 ???C (99.3 ???F) (07/02 1200)  Pulse: 85 (07/02 1240)  Respirations: 10 PER MINUTE (07/02 1240)  SpO2: 100 % (07/02 1240)  SpO2 Pulse: 84 (07/02 1200)  Height: 173 cm (5' 8.11) (07/02 1000) BP: (91-142)/(27-72)   Temp:  [37.1 ???C (  98.8 ???F)-37.6 ???C (99.7 ???F)]   Pulse:  [67-98]   Respirations:  [0 PER MINUTE-29 PER MINUTE]   SpO2:  [100 %]         Intake/Output Summary: (Last 24 hours)    Intake/Output Summary (Last 24 hours) at 04/18/2019 1259  Last data filed at 04/18/2019 1200  Gross per 24 hour   Intake 2625.98 ml   Output 1132 ml   Net 1493.98 ml       Physical Exam   General Appearance: Intubated,   Neck Veins: neck veins cannot be assessed  Chest Inspection: chest is normal in appearance   Auscultation/Percussion: lungs coarse  Cardiac Rhythm: regular rhythm and normal rate   Cardiac Auscultation: S1, S2 normal, no rub, no gallop   Murmurs: no murmur Carotid Arteries: no bruits   Radial Arteries: normal symmetric radial pulses   Lower Extremity Edema: no lower extremity edema   Abdominal Exam: Obese  Neurologic Exam: Unresponsive      Laboratory Review:    Hematology:    Lab Results   Component Value Date    HGB 8.1 04/18/2019    HCT 23.2 04/18/2019    PLTCT 87 04/18/2019    WBC 10.7 04/18/2019    NEUT 90 05/09/2019    ANC 9.96 04/18/2019    ANC 11.12 05/01/2019    LYMPH 4 04/18/2019    ALC 0.74 04/20/2019    MONA 4 05/15/2019    AMC 0.52 04/30/2019    ABC 0.04 04/24/2019    MCV 90.1 04/18/2019    MCHC 34.7 04/18/2019    MPV 8.5 04/18/2019    RDW 15.3 04/18/2019   , General Chemistry:    Lab Results   Component Value Date    NA 135 04/18/2019    K 6.0 04/18/2019    CL 99 04/18/2019    GAP 11 04/18/2019    BUN 56 04/18/2019    CR 3.87 04/18/2019    GLU 176 04/18/2019    CA 8.5 04/18/2019    ALBUMIN 3.3 04/18/2019    LACTIC 1.3 04/18/2019    MG 1.7 04/18/2019    TOTBILI 1.5 04/18/2019    and Cardiac markers:    Lab Results   Component Value Date    TNI 1.32 04/18/2019       ECG: normal sinus rhythm, lbbb  Telemetry: Sinus      Jesse Sans, MD

## 2019-04-18 NOTE — Progress Notes
Critical Care   Admission History and Physical Assessment       Name:  Alexandria Morgan                                             MRN:  [id number]   Admission Date:  04/30/2019    Principal Problem:    Cardiac arrest [facility])  Active Problems:    Leukocytosis    Acute respiratory failure with hypoxia and hypercapnia (HCC)    Encephalopathy    Seizure (HCC)    Myocardial injury    Acute on chronic heart failure with preserved ejection fraction (HFpEF) (HCC)    Elevated d-dimer    Acute abdominal pain    Acute kidney injury superimposed on CKD (HCC)    Hyperkalemia    Hypomagnesemia    Lactic acidosis    Hematuria    Cellulitis    Intra-abdominal infection                     Assessment and Plan     Hospital Course: Alexandria Morgan???is a 58 y/o???female???with history of COPD, HTN, HLD, DM,???diverticulitis s/p colostomy,???and recently incarcerated???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 CXR w/ volume overload; BNP 2500. Laboratory findings remarkable for: Hyperkalemia (K 7.4), AKI (Cr 3.7), trop 0.49, lactic acid 1.6, VBG 7.18/44. Suffered spontaneous cardiac arrest first w/ asystole, followed by PEA. After ~45 achieved ROSC. Cardiology consulted. Likely etiology: acute hypoxia, acidosis, PE, and/or hyperkalemia. Intubated in ED. Admitted to MICU post-cardiac arrest on mechanical ventilation. CT chest w/ pulmonary edema and bilateral consolidative opacities. Renal consulted for emergent HD given volume overload and hyperkalemia. CT ap w/ concern for closed loop type obstruction. General surgery consulted; no indication for surgical intervention given overall instability and high risk acute decompensation. Now with seizure-like activity and poor neurological assessment; concern for anoxic injury given vEEG read and CT head findings. Neurology consulted. Ongoing hyperkalemia despite pharmacological treatment and HD. Family declined further HD treatment and plan to meet tonight at 1800 and discuss transitioning to COVID.  Palliative Care consulted for goals of care assistance.     NEURO:   Encephalopathy  Seizure like activity  C/f Anoxic injury s/p cardiac arrest   - No induction agents utilized w/ intubation  - No sedation gtts upon arrival to MICU  - CT head 7/1 unremarkable  - vEEG 7/1: burst suppression pattern w/ no reactivity; poor prognostic indicator - S/p rocuronium administration that led to paralysis of myoclonic movements though imaging continued to display burst   - Negative gag, cough, corneals, pupils fixed, not overbreathing MV  Plan  - Neurology consulted  - cvEEG  - Fentanyl and Propofol gtts off since 2350 7/1   - PRN Ativan for seizure like activity  - MRI head this AM; will discuss during family meeting today and re-order if indicated   - Palliative care consulted for goals of care given poor prognosis   H/o anxiety/depression  ? Parkinsonian Dx  - Hold PTA Buspar 7.5 mg BID & Sertraline 50mg  D  - Hold PTA Requip     CV:   S/p Cardiac Arrest  - First w/ asystole, then PEA arrest for ~45 minutes in ED; s/p 6mg  epinephrine, 550 mg Na HCO3, 5g Ca+ gluconate, D50  - Likely d/t hyperkalemia +/-  acidosis +/- hypoxia +/- PE (w/ elevated DDimer)  Myocardial Injury   Troponin Elevation   - DDx: 2/2 pulmonary embolism (given recent history of progressively worsening shortness of breath and an elevated d-dimer on admission) vs recent cardiac arrest   - EKG on admit: Average heart rate 95, QTc 582 with left bundle branch morphology. ???No evidence of acute ST segment/T wave changes  - Trop 0.49 >> .93 >> 1.03 >> 1.32  Acute on Chronic HFpEF???(improving)  -???Echo 10/19/18???w/ EF of 60%, normal diastolic function, no significant valvular abnormalities  - BNP 2500 on admit  - PTA Lasix 40 mg D & Spironolactone 50 mg D   - CT chest 7/1: moderate cardiomegaly and pulmonary congestion c/w CHF and/or volume overload  - Echo 7/2: mildly reduced LVEF 45%, LV mildly dilated w/ septal hypokinesis, abnormal septal motion c/w LBBB, grade 1 diastolic dysfunction, mild mitral annular calcification w/ trace regurg, trace tricuspid regurg, PSPAP 42 mm Hg; negative for intracardiac shunt  H/o HTN  H/o HLD  - PTA Amlodipine 10mg  D, Atorvastatin 40mg  D, Carvedilol 25 mg BID  - Lipid panel 7/1 WNL, except low HDL  Plan  - Cardiology consulted, appreciate recs  - Trend trops q6h    - Trend EKGs q6h     - Volume management with HD -  stop lasix today w/ nearly no urine output  - Continue PTA Atorvastatin 20mg  D, ASA 81 mg D     - Hold PTA Spironolactone & Lasix, Amlodipine, Carvedilol   ???  PULM:   Acute hypoxic and hypercapnic respiratory failure  Acute pulmonary edema   Shortness of breath (CC)  - Likely d/t volume overload   - CXR 7/1: Pulmonary edema  - CT chest 7/1: volume overload; patchy consolidative tree-in-bud opacities t/o both lungs (R>L) s/o atypical pneumonia  - Current MV settings: V/AC TV 450, RR 28, P+10, FiO2 40 - PIPs 26-28  - Most recent ABG: 7.45 / 36 / 179 / 26.4  Plan  - Drop P+ to 5  - Daily ABG   - Volume management per HD  - PRN Duo-nebs  Elevated D-Dimer  - Ddimer 2136  - BLLE doppler 7/1: negative for DVT  H/o COPD  - PTA ProAir q4h PRN  H/o Right lower lobe mass   - CT chest 7/1: ill-defined subcentimeter nodule in the RLL stable since Nov 2019  - Recommend f/u outpatient  ???  GI:   - 7/2 AST 58, ALT 33, ALP 68, TB 1.5  Acute abdominal pain  S/p Incarcerated Hernia Repair   Colostomy   - 08/28/18???underwent exploratory laparotomy, LOA, reduction of internal hernia, partial small bowel resection x2, parastomal hernia repair???and ostomy placement???with Dr. Mayford Morgan for  incarcerated parastomal hernia and internal hernia for which she underwent an e   -???Re-admitted 10/02/18???for surgical site infection treated w/ IR drainage and Zosyn  - Presented with acute abdominal pain this hospitalization - CT AP 7/1: Development of mildly dilated small bowel within the left moderate size parastomal hernia consistent with mechanical small bowel obstruction, with the transition points at the stomal neck; could be closed loop type obstruction.  Mildly dilated short segment of bowel loop in the central anterior abdomen abutting the anterior abdominal wall with additional adjacent normal caliber bowel loops abutting the anterior abdominal wall. No definite transition point identified. Mild hepatosplenomegaly.   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  ENDO:   DM2   Hyperglycemia  - SBG 100s   - A1c 5.2  Plan  - Hold PTA Victoza   - Hold CF while NPO  ???  ID:   Leukocytosis (improving)   Bacteremia   UTI   CAP  C/f intra-abdominal source  LLQ cellulitis  - 7/2 WBC 10.7; afebrile  - Prior cx data: PSAE LLQ wound resistant to Trimethsulfa and intermediate resistance to Ceftazidime & Zosyn - CXR as above  - CT c/a/p as above  - BC 7/1 gram + cocci resembling staph; cx pending    - SC 7/1 gram + cocci, few gram - rods; cx pending  - UA 7/1 5-10 WBC, 1+ leuks, moderate bacteria; cx pending  - PCT 7/1 15.8  - Covid-19 7/1 negative  - Fungitell & galactomannan 7/1 pending  Plan  - Cover w/ Vanc, Cefepime and Flagyl   - Wound consulted   ???  HEME:   Normocytic Anemia  - 7/2 Hgb 8.1 Plt 87  - Anemia since surgery for diverticulitis in 2019   - Previous w/u determine chronic disease and anemia of CKD  - PTA Epo 10000 q7D  Plan  -???Transfuse for <8 Hgb    SKIN:  Right frank cellulitis   Mid chest skin tear  Right upper arm ecchymosis  Coccyx Stage 2  Left buttocks deep tissue pressure injury  Right buttocks deep tissue pressure injury   - Wound team consulted; recs placed    ???  Prophylaxis Review:  Lines: [employer], Dialysis Catheter and Peripheral Line  Tubes: OG, ETT  Diet: No  Insulin: No  Urinary Catheter: Yes  VTE ppx: Hep gtt; SCDs  GI ppx: PPI  PT/OT: Yes  ???  Code status: [id number]  Disposition: Continue in MICU. Meeting amongst family members tonight at 1800 to determine whether to transition to CMO. Explained what transitioning to CMO would look like.   ???  - [id number]. Pt critically ill with the above diagnoses. I spent 55 minutes providing critical care services including:  ??? reviewing outside records and obtaining history from the patient/family members  ??? performing a physical examination  ??? serially reviewing laboratory, telemetry, hemodynamic, oximetry, and respiratory data  ??? reviewing radiographic images  ??? reviewing medications  ??? managing fluids/electrolytes, antibiotics, ICU prophylaxis, and  mechanical ventilation   ??? developing the overall plan of care  ???  Starlyn Skeans, APRN   Pulm/Critical Care  Pager [phone]  Available on Voalte and Cureatr.     Pt seen and discussed w/ Dr. Emilee Hero.   ???  M3 team pager (2nd call/nights) [phone] ___________________________________________________________________________  Subjective:     Review of systems not obtained due to patient factors.      Objective:   Medications:  Scheduled Meds:aspirin chewable tablet 81 mg, 81 mg, Oral, QDAY  atorvastatin (LIPITOR) tablet 20 mg, 20 mg, Oral, QHS  cefepime (MAXIPIME) 2 g in sodium chloride 0.9% (NS) 100 mL IVPB (MB+), 2 g, Intravenous, Q24H*  chlorhexidine gluconate (PERIDEX) 0.12 % solution 15 mL, 15 mL, SEE ADMIN INSTRUCTIONS, BID(8-20)  dextran 70/hypromellose (GENTEAL TEARS) ophthalmic solution 1 drop, 1 drop, Both Eyes, QID  folic acid (FOLVITE) tablet 1 mg, 1 mg, Oral, QDAY  heparin (porcine) injection 3,500-7,000 Units, 20-40 Units/kg, Intravenous, As Prescribed  metroNIDAZOLE (FLAGYL) 500 mg IVPB 100 mL, 500 mg, Intravenous, Q8H*  pantoprazole (PROTONIX) injection 40 mg, 40 mg, Intravenous, QDAY  polyethylene glycol 3350 (MIRALAX) packet 17 g, 1 packet, Oral, BID  senna/docusate (SENOKOT-S) tablet 2 tablet, 2 tablet, Oral, BID  SODIUM CHLORIDE 0.9 % IV SOLP (Cabinet Override), , , NOW  vancomycin, random dosing, 1 each, Intravenous, Random Dosing    Continuous Infusions:  ??? fentaNYL (SUBLIMAZE)  1000 mcg/100 mL NS IV drip (std conc)(premade) Stopped (05/12/19 2350)   ??? furosemide (LASIX) 500 mg in 50 mL IV drip syr (max conc) 10 mg/hr (05/12/19 2026)   ??? heparin (porcine) 20,000 units/D5W 500 mL infusion (std conc)(premade) 1,100 Units/hr (04/18/19 0021)   ??? propofol (DIPRIVAN) 10 mg/mL IV drip Stopped (05/12/2019 2351)     PRN and Respiratory Meds:albuterol 0.5% Q4H PRN **AND** ipratropium bromide Q4H PRN, alteplase PRN (On Call from Rx), anticoagulant sodium citrate QDAY PRN, LORazepam  (ATIVAN)  injection PRN, naloxone PRN, [DISCONTINUED] vancomycin  (0-40 kg) IV Q12H* **AND** vancomycin, pharmacy to manage Per Pharmacy                       Vital Signs: Last Filed                  Vital Signs: 24 Hour Range   BP: 106/43 (07/02 0400) Temp: 37.4 ???C (99.3 ???F) (07/02 0400)  Pulse: 68 (07/02 0558)  Respirations: 28 PER MINUTE (07/02 0558)  SpO2: 100 % (07/02 0558)  SpO2 Pulse: 72 (07/02 0400) BP: (91-189)/(27-108)   Temp:  [37 ???C (98.6 ???F)-38.1 ???C (100.5 ???F)]   Pulse:  [29-145]   Respirations:  [0 PER MINUTE-61 PER MINUTE]   SpO2:  [19 %-100 %]    Intensity Pain Scale (Self Report): 10 (05-12-19 0817) Vitals:    05/12/2019 1100 May 12, 2019 1507 05/12/2019 1715   Weight: (!) 175 kg (385 lb 12.9 oz) (!) 175 kg (385 lb 12.9 oz) (!) 175 kg (385 lb 12.9 oz)           Intake/Output Summary:  (Last 24 hours)    Intake/Output Summary (Last 24 hours) at 04/18/2019 9811  Last data filed at 04/18/2019 0400  Gross per 24 hour   Intake 2080.92 ml   Output 857 ml   Net 1223.92 ml         Physical Exam:      General:  Unarousable on MV, appears older then stated age  Head:  Normocephalic, without obvious abnormality, atraumatic  Eyes:  Conjunctivae/corneas clear.  PERRL  Mouth/throat: Moist mucous membranes. ETT and OG tube in place.   Neck:  Supple, symmetrical, trachea midline  Lungs:  Diminished breath sounds throughout. No wheeze. Not overbreathing MV.   Heart:  Regular rate and rhythm, S1, S2 normal, no murmur appreciated  Abdomen:  Obese, soft, non-tender.  LLQ colostomy without output. Bowel sounds normal  Extremities:  Extremities normal, atraumatic, no cyanosis. +2 non-pitting LE pulmonary edema   Pulses:   2+ and symmetric, all extremities  Skin:   Multiple skin changes as described above and documented by RN staff in further detail  Neurologic:  Unresponsive with no sedation on MV. Displaying vent dyssynchrony. Intermittent whole body tremor c/f seizure activity.     Artificial airway:  Endotracheal Tube                                                                                         Ventilator/ Respiratory Therapy:  Yes: PaO2 (Manual entry):  [id number] MMHG]   FiO2 (Manual entry):  [0.4]   P/F Ratio (Calculated):  [418]   Mode: V/AC+ Set Vt (ml):  [450 milliliters]   Expired Tidal Volume Spont (mL):  [376 milliliters-602 milliliters]   Set RR:  [28 breaths/minutes]   Total Respiratory Rate (Breaths/Min):  [28 breaths/minutes-29 breaths/minutes]   Minute Volume (L/min):  [12.9 liters/minutes-14.8 liters/minutes]   %MVspon:  [0 %]   O2%:  [40 %]   PIP Actual:  [25 cm H20-28 cm H20]   PEEP/CPAP:  [10 cm H2O]      Vent weaning trial:  Per protocol    Laboratory:  LABS:  Recent Labs     04/29/2019  0859 04/28/2019  1208 04/25/2019  1822 05/03/2019  2321 04/18/19  0425   NA 130* 144 138 137 137   K 7.4* 6.9* 5.3* 5.6* 5.9*   CL 106 108 100 100 100   CO2 13* 26 25 25 26    GAP 11 10 13* 12 11   BUN 59* 63* 46* 50* 54*   CR 3.70* 3.89* 2.98* 3.57* 3.75*   GLU 134* 105* 128* 158* 179*   CA 8.8 8.9 8.9 8.4* 8.4*   ALBUMIN 4.2 3.4*  --   --  3.3*   MG  --  1.2* 1.5* 1.4* 1.6   PO4  --  5.9* 3.7 4.3 4.5   HGBA1C  --  5.2  --   --   --        Recent Labs     04/18/2019  0859 05/07/2019  1208 04/18/2019  1822 04/29/2019  2321 04/18/19  0425   WBC 12.4* 10.8  --   --  10.7   HGB 8.7* 9.1*  --   --  8.1*   HCT 26.6* 26.6*  --   --  23.2*   PLTCT 129* 107*  --   --  87*   INR 1.4* 1.5*  --   --  1.5*   PTT 24.6 30.3  --  37.8* 43.4*   AST 29 70*  --   --  58*   ALT 21 41  --   --  33   ALKPHOS 84 88  --   --  68   TNI  --  0.58* 0.93* 1.03*  --       CrCl cannot be calculated (Unknown ideal weight.).  Vitals:    04/25/2019 1100 05/11/2019 1507 05/03/2019 1715   Weight: (!) 175 kg (385 lb 12.9 oz) (!) 175 kg (385 lb 12.9 oz) (!) 175 kg (385 lb 12.9 oz)    No results for input(s): PHART, PO2ART in the last 72 hours.    Invalid input(s): PC02A        Radiology and Other Diagnostic Procedures Review:    Reviewed

## 2019-04-18 NOTE — Progress Notes
1930 - VSS stable. Gtt's verified with offgoing RN. Pt non-responsive to all stimuli.     2000 - EEG tech at bedside and placing leads.     2110  EEG tech called to say patient having seizures. Neuro physician requesting ICU team to give rocuronium. Neuro physician directed to MICU fellow.     0030  Dr. Daiva Nakayama notified that pt's troponin still increasing, but labs have been continued. Per Dr. Daiva Nakayama, continue to trend troponin until it peaks.

## 2019-04-18 NOTE — Progress Notes
CLINICAL NUTRITION                                                        Clinical Nutrition Initial Assessment    Name: Alexandria Morgan        MRN: [id number]          DOB: 03/18/1961          Age: 58 y.o.  Admission Date: 05/14/2019             LOS: 1 day        Recommendation:  REC TPN vs. EN if GOC align with aggressive nutrition interventions.      Comments:   89yoF with history of COPD, HTN, HLD, DM, diverticulitis s/p colostomy, and recently incarcerated parastomal hernia s/p repair (08/2018) c/b wound infection in December; presents with shortness of breath and abdominal pain 7/1. Suffered cardiac arrest in the ED. Transferred to MICU w/ respiratory failure s/p cardiac arrest with AKI, hyperkalemia, encephalopathy, pulmonary edema, myocardial injury and c/f closed loop type bowel obstruction. Pt on vent. OGT to LIWS. Serum K+ 6 this a.m. Received HD yesterday.     Nutrition Assessment of Patient:   Desired Weight: 64 kg  BMI (Calculated): 58.35; BMI Categories Adult: Obesity Class III: 40 and over; Appearance: Obese  Pertinent Allergies/Intolerances: reviewed  Pertinent Labs: K 6; Pertinent Meds: folic acid, bowel ; Unintentional Weight Loss: (none)  Oral Diet Order: NPO;       Current Oral Intake: NPO           Estimated Calorie Needs: 1400-1600(22-25 kcal/kg IBW)  Estimated Protein Needs: 128-160(2-2.5g/kg IBW)    Malnutrition Assessment:   Does not meet criteria;     Nutrition Focused Physical Assessment:   Edema: Yes; Severity: Mild; Location: Generalized     Pressure Injury: stage ll, stage 1          Nutrition Diagnosis:  Inadequate protein-energy intake  Etiology: medical status  Signs & Symptoms: NPO day 2                      Intervention / Plan:  Assessed adequacy/tolerance of intakes  EN recs       Goals:  Initiate nutrition  Time Frame: Within 5 days    Alric Seton, RD, LD, CNSC  Office  [phone]  Available via [employer] [employer] or by Cureatr/pager [phone]

## 2019-04-18 NOTE — Progress Notes
EEG electrodes were successfully placed on pt's scalp and secured without complications. V-EEG was instructed to both the pt and their family. All questions were answered and addressed to their liking. Seizure pads are placed on the bed rail. The event button and emergent call light are placed within the pt and their family's reach. Video, sound and EEG tracings are all functioning properly.

## 2019-04-18 NOTE — Consults
Wound Ostomy Nursing Consult Service    NAME:Alexandria Morgan                                                                   MRN: [id number]                 DOB:[birth year]          AGE: 58 y.o.  ADMISSION DATE: 2019/05/17             DAYS ADMITTED: LOS: 1 day      Reason for Consult: pressure injury Stage II or greater, wound not pressure and other/ostomy management    Assessment/Plan:    Principal Problem:    Cardiac arrest [facility])  Active Problems:    Leukocytosis    Acute respiratory failure with hypoxia and hypercapnia (HCC)    Encephalopathy    Seizure (HCC)    Myocardial injury    Acute on chronic heart failure with preserved ejection fraction (HFpEF) (HCC)    Elevated d-dimer    Acute abdominal pain    Acute kidney injury superimposed on CKD (HCC)    Hyperkalemia    Hypomagnesemia    Lactic acidosis    Hematuria    Cellulitis    Intra-abdominal infection      Wounds (NOT for Pressure Injuries) 05/17/19 1200 Right Flank Cellulitis (Active)   05/17/2019 1200 Flank   Wound Orientation: Right   Wound Type: Cellulitis   Wound Type::    Wound Description (Comments):    Wound Image   04/18/2019 11:10 AM           Wounds (NOT for Pressure Injuries) 05/17/19 Mid Chest Skin Tear (Active)   05/17/2019  Chest   Wound Orientation: Mid   Wound Type: Skin Tear   Wound Type::    Wound Description (Comments):    Wound Image   04/18/2019 11:10 AM   Wound Base Assessment Moist;Red 04/18/2019 11:10 AM   Surrounding Skin Assessment Intact;Bruised 04/18/2019 11:10 AM   Wound Site Closure None 04/18/2019 11:10 AM   Wound Drainage Amount Scant 04/18/2019 11:10 AM   Wound Drainage Description Serosanguineous 04/18/2019 11:10 AM   Wound Dressing Status Dressing Reinforced 04/18/2019 11:10 AM   Wound Dressing and / or Treatment Other (Comment) 04/18/2019 11:10 AM   Wound Length (cm) 1 cm 04/18/2019 11:10 AM   Wound Width (cm) 3.5 cm 04/18/2019 11:10 AM   Wound Depth (cm) 0.1 cm 04/18/2019 11:10 AM   Wound Surface Area (cm^2) 3.5 cm^2 04/18/2019 11:10 AM Wound Volume (cm^3) 0.35 cm^3 04/18/2019 11:10 AM   Wound Status (Wound Team Only) Being Treated 04/18/2019 11:10 AM   Number of days: 1       Wounds (NOT for Pressure Injuries) 04/18/19 Right;Upper Arm Ecchymosis (Active)   04/18/19  Arm   Wound Orientation: Right;Upper   Wound Type: Ecchymosis   Wound Type::    Wound Description (Comments):    Wound Image   04/18/2019 11:10 AM   Number of days: 0     Pressure Injury 05/17/19 1200 Yes Coccyx Stage 2 (Active)   2019/05/17 1200    Pressure Injury Present On Inpatient Admission: Y   Pressure Injury Orientation:    Wound Location: Coccyx   Pressure Injury Stages: Stage  2   If this pressure injury is suspected to be device related, please select the device::    Wound Image   04/18/2019 11:10 AM   Wound Dressing Status Changed 04/18/2019 12:00 PM   Wound Dressing and / or Treatment Criticaid Barrier Cream 04/18/2019 11:10 AM   Wound Drainage Description Serosanguineous 04/18/2019 11:10 AM   Wound Drainage Amount Scant 04/18/2019 11:10 AM   Wound Base Assessment Moist;Pink;Red 04/18/2019 11:10 AM   Surrounding Skin Assessment Dry;Intact 04/18/2019 11:10 AM   Wound Status (Wound Team Only) Being Treated 04/18/2019 11:10 AM   Wound Length (cm) 1.5 cm 04/18/2019 11:10 AM   Wound Width (cm) 0.4 cm 04/18/2019 11:10 AM   Wound Depth (cm) 0.1 cm 04/18/2019 11:10 AM   Wound Surface Area (cm^2) 0.6 cm^2 04/18/2019 11:10 AM   Wound Volume (cm^3) 0.06 cm^3 04/18/2019 11:10 AM       Pressure Injury 04/18/19 Yes Left Buttocks Deep Tissue Pressure Injury (Active)   04/18/19     Pressure Injury Present On Inpatient Admission: Y   Pressure Injury Orientation: Left   Wound Location: Buttocks   Pressure Injury Stages: Deep Tissue Pressure Injury   If this pressure injury is suspected to be device related, please select the device::    Wound Image   04/18/2019 11:10 AM   Wound Dressing Status Intact 04/18/2019 11:10 AM   Wound Dressing and / or Treatment Criticaid Barrier Cream 04/18/2019 11:10 AM Wound Drainage Amount None 04/18/2019 11:10 AM   Wound Base Assessment Intact;Purple 04/18/2019 11:10 AM   Surrounding Skin Assessment Dry;Intact 04/18/2019 11:10 AM   Wound Status (Wound Team Only) Being Treated 04/18/2019 11:10 AM       Pressure Injury 04/18/19 Yes Right Buttocks Deep Tissue Pressure Injury (Active)   04/18/19     Pressure Injury Present On Inpatient Admission: Y   Pressure Injury Orientation: Right   Wound Location: Buttocks   Pressure Injury Stages: Deep Tissue Pressure Injury   If this pressure injury is suspected to be device related, please select the device::    Wound Dressing Status Intact - see photo attached to Left Buttock DTI 04/18/2019 11:10 AM   Wound Dressing and / or Treatment Criticaid Barrier Cream 04/18/2019 11:10 AM   Wound Drainage Amount None 04/18/2019 11:10 AM   Wound Base Assessment Intact;Purple 04/18/2019 11:10 AM   Surrounding Skin Assessment Dry;Intact 04/18/2019 11:10 AM   Wound Status (Wound Team Only) Being Treated 04/18/2019 11:10 AM         Coccyx with Stage 2 pressure injury; DTI development noted on bilateral upper buttocks. Pt intubated and sedated; pt is also morbidly obese. Foley catheter in place. 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 One piece pouch intact with no sign of leak at time of visit. Pt assessed with primary RN. Recommendations discussed.    PLAN:  Buttocks/Coccyx -  - Place pt on bariatric low air loss bed.  - Apply barrier cream BID and PRN moisture from urine/stool.  - Avoid briefs if possible; use only one disposable pad underneath pt.  - Implement q2 hr turning schedule using foam wedge for support.  - HOB less than or equal to 30 degrees unless contraindicated (to prevent shearing at coccyx/sacrum)    Skin folds (abdominal, breasts, groin) -   - Wash with soap and water. Dry well. Place Interdry AG sheets flat within skin fold leaving at least 2 inches of fabric exposed to promote moisture wicking. Change q5  days and if grossly soiled.    Chest skin tear -   - Clean with saline and gauze. Place Silver contact layer over wound base then cover with small foam dressing. Change q72 hrs.    OSTOMY CARE:   Ostomy care is appropriate for bedside nursing staff to manage.  Suggested Pouching Supplies: Hollister 8901 one piece pouch (supplies available from [employer] [phone])  1.   Change pouch with any sign of leaking. Do not reinforce leaking pouch with tape  2.   Clean skin with water only - no soap or wipes  3.   If skin is broken down surrounding stoma, sprinkle stoma powder and wipe away the excess  4.  Spray no sting skin prep on powdered skin and let dry completely  5.  Cut new pouch leaving 1/8-1/4 of skin showing between the stoma and pouch  6.  Warm new pouch in palm of hands  7.  Ensure skin is dry and apply new pouch  8.  Lay warm hand over pouch once it's applied    Wound care and maintenance orders placed per skin integrity protocol.     Marcelino Duster, RN, BSN, [employer] Nursing Consult Service  Office: [phone]  Available via [facility] text or by [employer]/pager [phone]  After hours/weekends, page [facility] Team @ [phone]

## 2019-04-18 NOTE — Case Management (ED)
Case Management Admission Assessment    NAME:Alexandria Morgan                          MRN: [id number]             DOB:[birth year]          AGE: 58 y.o.  ADMISSION DATE: 04/22/2019             DAYS ADMITTED: LOS: 1 day      Today???s Date: 04/18/2019    Source of Information: past medical records     Plan  Plan: Case Management Assessment, Assist PRN with SW/NCM Services, Discharge Planning for Facility Anticipated  Discharge planning ongoing  Continue ICU Care  Intubated  Palliative Care, Surgery, Neurology, Cardiology, Renal, and Wound Care  PT/OT/ST      SW contacted sister-Rosalee for support due to patient's critical condition. She states I know what needs to happen but I am giving my siblings time. She denies any needs.   SW attempted to reach cousin-John (listed as emergency contact at [facility]) today for support. No answer.     Sister-Rosalee and [facility] do not have DPOA/ACP documents.     SW provided contact information and encouraged patient/family to call with questions or concerns.     Patient Address/Phone  [address]  [place] [address]  [phone] (home)     Emergency Contact  Extended Emergency Contact Information  Primary Emergency Contact: Burman Nieves  Mobile Phone: [phone]  Relation: Sister    Secondary Emergency Contact: Mitchell,John  Mobile Phone: [phone]  Relation: Cousin     Healthcare Directive     Unknown    Transportation  Does the patient need discharge transport arranged?: No  Transportation Name, Phone and Availability #1: LTC    Expected Discharge Date  Expected Discharge Date: 04/26/19    Living Situation Prior to Admission  ? Living Arrangements  Type of Residence: Skilled nursing facility(Resident at [facility] since 09/06/2018)  Living Arrangements: Other (Comment)     ? Level of Function   Prior level of function: Needs assist with ADLs     ? Cognitive Abilities   Cognitive Abilities: Unable to Assess    Financial Resources  ? Coverage Primary Insurance: Medicare Replacement(UHC)  Secondary Insurance: Medicaid(UHC)  Additional Coverage: RX      ? Source of Income   Source Of Income: SSDI     ? Financial Assistance Needed?  no    Psychosocial Needs  ? Mental Health  Mental Health History: (Unable to assess)     ? Substance Use History  Substance Use History Screen: No     ? Other      Current/Previous Services  ? PCP  Nicole Cella, [phone], [phone]     ? Pharmacy    [facility] [phone] - [place], [place] - [address]  [address]  [place] [address]  Phone: [phone] Fax: [phone]    ? Durable Medical Equipment   Durable Medical Equipment at home: ([facility] provides DME )     ? Home Health  Receiving home health: In the past  Agency name: [facility] (2019)  Would patient use this agency again?: No     ? Hemodialysis or Peritoneal Dialysis  Undergoing hemodialysis or peritoneal dialysis: No     ? Tube/Enteral Feeds  Receive tube/enteral feeds: No     ? Infusion  Receive  infusions: No     ? Private Duty  Private duty help used: No     ? Home and Community Based [employer] and community based services: No     ? [employer]: N/A     ? Hospice  Hospice: No     ? Outpatient Therapy  PT: No  OT: No  SLP: No     ? Skilled Nursing Facility/Nursing Home  SNF: In the past  Name of Facility: [facility]; [place]   [facility]: Yes  Name of Facility: Resident at [facility]     ? Inpatient Rehab  IPR: No     ? Long-Term Acute Care Hospital  LTACH: No     ? Acute Hospital Stay  Acute Hospital Stay: In the past  Was patient's stay within the last 30 days?: No    Lenna Sciara, LMSW   [phone] (phone)  [phone] (pager)

## 2019-04-18 NOTE — Progress Notes
Pharmacy Vancomycin Note  Subjective:   Alexandria Morgan is a 58 y.o. female being treated for Gm+ cocci septicemia.    Objective:     Current Vancomycin Orders   Medication Dose Route Frequency    vancomycin (VANCOCIN) 1,750 mg in sodium chloride 0.9% (NS) IVPB  1,750 mg Intravenous ONCE    vancomycin, pharmacy to manage  1 each Service Per Pharmacy    vancomycin, random dosing  1 each Intravenous Random Dosing     Start Date of  vancomycin therapy: 05/10/2019  White Blood Cells   Date/Time Value Ref Range Status   04/18/2019 0425 10.7 4.5 - 11.0 K/UL Final   05/14/2019 1208 10.8 4.5 - 11.0 K/UL Final   04/18/2019 0859 12.4 (H) 4.5 - 11.0 K/UL Final     Creatinine   Date/Time Value Ref Range Status   04/18/2019 1213 4.62 (H) 0.4 - 1.00 MG/DL Final   04/18/2019 0605 3.87 (H) 0.4 - 1.00 MG/DL Final   04/18/2019 0425 3.75 (H) 0.4 - 1.00 MG/DL Final     Blood Urea Nitrogen   Date/Time Value Ref Range Status   04/18/2019 1213 60 (H) 7 - 25 MG/DL Final     Estimated CrCl:      Intake/Output Summary (Last 24 hours) at 04/18/2019 1334  Last data filed at 04/18/2019 1300  Gross per 24 hour   Intake 2255.98 ml   Output 1132 ml   Net 1123.98 ml      UOP:    Actual Weight:  174.6 kg (385 lb)  Dosing BW:  175 kg   Drug Levels:  Vancomycin Random   Date/Time Value Ref Range Status   04/18/2019 1213 15.9 MCG/ML Final       Assessment:   Target levels for this patient:   ~15 to redose    Evaluation of AUC and/or level(s): 24hrs after initial dose of 2500mg  pts vancomycin level is 15.9 and will plan on giving a repeat dose     Plan:   1. Will give 10mg /kg (1750mg ) IV vancomycin x 1 now and then repat level 24hrs post dose  2. Next scheduled level(s): 24hrs post dose  3. Pharmacy will continue to monitor and adjust therapy as needed.    [place], [facility]  04/18/2019

## 2019-04-18 NOTE — Progress Notes
PHYSICAL THERAPY  NOTE      Name: Alexandria Morgan        MRN: [id number]          DOB: 02-Oct-1961          Age: 58 y.o.  Admission Date: 04/26/2019             LOS: 1 day      Discussed patient with bed side nurse. Patient not appropriate for therapy intervention due to medical status. Patient not following commands. Physical therapy will continue to follow and provide intervention as appropriate.    Therapist: Randalyn Rhea, PT, DPT  Date: 04/18/2019

## 2019-04-18 NOTE — Procedures
VIDEO EEG REPORT    Alexandria Morgan  [birth year]  [id number]    Date of service: 04/18/19    History: This is a 58 y.o. female presenting with cardiac arrest and myoclonic jerks.       Current Facility-Administered Medications:   ???  albuterol 0.5% (PROVENTIL) nebulizer solution 2.5 mg, 2.5 mg, Inhalation, Q4H PRN **AND** ipratropium bromide (ATROVENT) 0.02 % nebulizer solution 0.5 mg, 0.5 mg, Inhalation, Q4H PRN, Jones, Hadleigh S, APRN-NP  ???  alteplase (CATHFLO ACTIVASE) injection 2 mg, 2 mg, Intra-catheter, PRN (On Call from Rx), Theodoro Kalata, MD  ???  anticoagulant sodium citrate solution 1-6 mL, 1-6 mL, Intra-catheter, QDAY PRN, Theodoro Kalata, MD, 2.8 mL at May 15, 2019 1519  ???  aspirin chewable tablet 81 mg, 81 mg, Oral, QDAY, Jones, Hadleigh S, APRN-NP, 81 mg at 05-15-19 1604  ???  atorvastatin (LIPITOR) tablet 20 mg, 20 mg, Oral, QHS, Jones, Hadleigh S, APRN-NP, 20 mg at May 15, 2019 2031  ???  cefepime (MAXIPIME) 2 g in sodium chloride 0.9% (NS) 100 mL IVPB (MB+), 2 g, Intravenous, Q24H*, Provider, Pharmacy  ???  chlorhexidine gluconate (PERIDEX) 0.12 % solution 15 mL, 15 mL, SEE ADMIN INSTRUCTIONS, BID(8-20), Starlyn Skeans S, APRN-NP, 15 mL at 2019/05/15 2031  ???  dextran 70/hypromellose (GENTEAL TEARS) ophthalmic solution 1 drop, 1 drop, Both Eyes, QID, Theodoro Kalata, MD, 1 drop at May 15, 2019 2131  ???  fentaNYL (SUBLIMAZE)  1000 mcg/100 mL NS IV drip (std conc)(premade), 25-100 mcg/hr, Intravenous, TITRATE, Jones, Cristopher Peru, APRN-NP, Stopped at 15-May-2019 2350  ???  folic acid (FOLVITE) tablet 1 mg, 1 mg, Oral, QDAY, Jones, Hadleigh S, APRN-NP, 1 mg at 15-May-2019 1343  ???  furosemide (LASIX) 500 mg in 50 mL IV drip syr (max conc), 10 mg/hr, Intravenous, Continuous, Jones, Hadleigh S, APRN-NP, Last Rate: 1 mL/hr at 2019-05-15 2026, 10 mg/hr at 05/15/19 2026  ???  heparin (porcine) 20,000 units/D5W 500 mL infusion (std conc)(premade), 0-2,000 Units/hr, Intravenous, TITRATE, Jones, Hadleigh S, APRN-NP, Last Rate: 27.5 mL/hr at 04/18/19 0021, 1,100 Units/hr at 04/18/19 0021  ???  heparin (porcine) injection 3,500-7,000 Units, 20-40 Units/kg, Intravenous, As Prescribed, Orlan Leavens, APRN-NP, 3,500 Units at 04/18/19 0020  ???  LORazepam (ATIVAN) injection 2 mg, 2 mg, SEE ADMIN INSTRUCTIONS, PRN, Yetta Barre, Hadleigh S, APRN-NP  ???  metroNIDAZOLE (FLAGYL) 500 mg IVPB 100 mL, 500 mg, Intravenous, Q8H*, Jones, Hadleigh S, APRN-NP, 500 mg at 05-15-19 2031  ???  naloxone (NARCAN) injection 0.08 mg, 0.08 mg, Intravenous, PRN, Orlan Leavens, APRN-NP  ???  pantoprazole (PROTONIX) injection 40 mg, 40 mg, Intravenous, QDAY, Jones, Hadleigh S, APRN-NP, 40 mg at 05/15/19 1350  ???  polyethylene glycol 3350 (MIRALAX) packet 17 g, 1 packet, Oral, BID, Orlan Leavens, APRN-NP, 17 g at 05-15-19 2031  ???  propofol (DIPRIVAN) 10 mg/mL IV drip, 5-30 mcg/kg/min, Intravenous, TITRATE, Jones, Cristopher Peru, APRN-NP, Stopped at 2019-05-15 2351  ???  senna/docusate (SENOKOT-S) tablet 2 tablet, 2 tablet, Oral, BID, Jones, Hadleigh S, APRN-NP, 2 tablet at 05-15-19 2031  ???  SODIUM CHLORIDE 0.9 % IV SOLP (Cabinet Override), , , NOW,   ???  [DISCONTINUED] vancomycin (VANCOCIN) 1,750 mg in dextrose 5% (D5W) IVPB (15-40 kg), 10 mg/kg, Intravenous, Q12H* **AND** vancomycin, pharmacy to manage, 1 each, Service, Per Pharmacy, Orlan Leavens, APRN-NP  ???  vancomycin, random dosing, 1 each, Intravenous, Random Dosing, Provider, Pharmacy    Introduction:  This study was performed using digital electroencephalographic recording equipment. International 10-20 electrode placement was used  along with FT9 and FT10 electrodes.     Description:   In the most stimulated state the background consisted of burst suppression pattern with bursts consisting of sharply contoured theta range activity ( ie 5 Hz) occurring over 1-2 seconds and periods of suppression lasting 5 - 30 seconds.   There were frequent myoclonic movements noticed along with these bursts with opening of the eyes and facial and bimanual myoclonic jerks.   Administration of rocuronium led to paralysis of these movements though the EEG continued to display burst suppression pattern.   As the recording progressed there were more continuous waveforms with appearance of delta range ( 2 - 3 Hz) rhythmic activity in association with the bursts without clear ictal evolution. Also the background seemed to be more continuous in activity as compared to the overnight study.    Technical interpretation:   This EEG is abnormal due to   - Presence of burst suppression in the background.  - Bursts consist of sharply contoured theta range activity which morphed into rhythmic delta activity towards the end of the recording.  - More continuous delta range activity noticed towards the end of the study.  - Myoclonic activity is considered to be suggestive of cortical origin.     Clinical correlation;   This vEEG in comatose state is abnormal  - Burst suppression pattern is suggestive of severe neuronal dysfunction and can be seen in post anoxic brain injury. It can also be seen in association with hypothermia, sedative / drug use / toxic, metabolic and infective etiologies.  - Towards the end of the study there is more continuous delta range activity that can be seen as compared to the start.   - Cortical myoclonic activity is noticed.    This report reflects the recording period from 2038 on 05/08/2019 to 0900 on 04/18/19.    Winona Legato, MD

## 2019-04-18 NOTE — Progress Notes
MICU Critical Care Progress Note          Tarynn Garling  Admission Date: 2019-05-07  LOS: 1 day                     Assessment/Plan:   Principal Problem:    Cardiac arrest [facility])  Active Problems:    Leukocytosis    Acute respiratory failure with hypoxia and hypercapnia (HCC)    Encephalopathy    Seizure (HCC)    Myocardial injury    Acute on chronic heart failure with preserved ejection fraction (HFpEF) (HCC)    Elevated d-dimer    Acute abdominal pain    Acute kidney injury superimposed on CKD (HCC)    Hyperkalemia    Hypomagnesemia    Lactic acidosis    Hematuria    Cellulitis    Intra-abdominal infection      Patient is critically ill secondary to the above diagnoses, but primarily concerned for the following:    1.  Status post cardiac arrest asystole followed by PEA most likely secondary to metabolic derangements and perhaps complicated by hypoxemia  2.  Acute hypoxemic respiratory failure with hypercapnia due to cardiac arrest  3.  Acute renal failure with hyperkalemia  4.  Acute encephalopathy with findings consistent with anoxic brain injury  5.  Possible closed-loop small bowel obstruction    Exam:  Vitals and I/O's reviewed  Neuro-patient unresponsive to verbal or tactile stimulation.  No gag reflex, no pupillary reflex  HEENT: ET tube in place  Neck: No lymphadenopathy  Chest-decreased throughout but no wheezing  CV-regular rhythm and rate  Abd-morbidly obese with colostomy without acute changes  Ext- no cyanosis or clubbing.  Mild bilateral lower extremity edema  Skin- no rashes      I spent 45 minutes evaluating the patient including personally reviewing images, labs, progress and consult notes, discussing with nursing, and examining patient.  Plan of care includes the following:     ??? Reviewed overnight EEG findings which are consistent with catastrophic anoxic brain injury.  We reviewed CT of brain from yesterday which does have findings of loss of gray-white matter interface concerning for anoxic injury.  She has no brainstem reflexes.  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  ??? Family meeting held with neurology and palliative care.  Expressed our concerns and findings of anoxic injury that is not recoverable.  Family wished to discuss events and would get back to [place] at a later time.  ??? Discussed with renal and as we were not able to transition to comfort measures only and hyperkalemia only progressed despite temporizing measures decision made to dialyze given patient was still a full code.  ??? Continue mechanical ventilation to optimize oxygenation and ventilation.  ??? Continue current antimicrobials and follow-up cultures.  ??? Continue heparin in the setting of possible acute coronary syndrome.  Echocardiogram unremarkable for acute changes.    Family did call back this afternoon and transition to DO NOT RESUSCITATE with further family discussions to happen later today and possible transition to comfort measures.        Theodoro Kalata, MD

## 2019-04-18 NOTE — Case Management (ED)
Case Management Progress Note    NAME:Markisha Lindenberger                          MRN: [id number]              DOB:May 26, 1961          AGE: 58 y.o.  ADMISSION DATE: 04/18/2019             DAYS ADMITTED: LOS: 1 day      Todays Date: 04/18/2019    Plan: Will continue to follow as condition evolves.    Interventions  ? Support  SW notified by palliative team that pt may have advanced care planning documents on file at her nursing facility.  SW contacted [facility]. Receptionist reviewed pt's electronic and physical chart. They do not have any advance care planning documents on file. Update provided to palliative team.    ? Info or Referral      ? Discharge Planning      ? Medication Needs                                                 ? Financial      ? Legal      ? Other        Disposition  ? Expected Discharge Date    Expected Discharge Date: 04/26/19  ? Transportation   Does the patient need discharge transport arranged?: No  Transportation Name, Phone and Availability #1: LTC  ? Next Level of Care (Acute Psych discharges only)      ? Discharge Disposition                                          Durable Medical Equipment      No service has been selected for the patient.      [place] Destination      No service has been selected for the patient.      [place]      No service has been selected for the patient.      [place] [facility] [facility]      No service has been selected for the patient.        Shon Hale, LMSW  Phone: [phone]

## 2019-04-18 NOTE — Progress Notes
EEG signals are clean of artifact and checked at 13:32:46.      Video was visualized and recording.  Audio recording was checked and functioning.  The VEEG system is on-line.  Central monitoring station is functioning appropriately.      The event button is operational and within reach of the patient and/or patient's family.    All impedances were below 10,000 Ohms.

## 2019-04-19 ENCOUNTER — Encounter: Admit: 2019-04-18 | Discharge: 2019-04-18 | Attending: Critical Care Medicine | Admitting: Critical Care Medicine

## 2019-04-19 ENCOUNTER — Emergency Department: Admit: 2019-04-17 | Discharge: 2019-04-18 | Attending: Critical Care Medicine

## 2019-04-19 ENCOUNTER — Encounter: Admit: 2019-04-17 | Discharge: 2019-04-17 | Attending: Critical Care Medicine | Admitting: Critical Care Medicine

## 2019-04-19 ENCOUNTER — Emergency Department: Admit: 2019-04-17 | Discharge: 2019-04-17 | Attending: Critical Care Medicine

## 2019-04-19 ENCOUNTER — Ambulatory Visit
Admit: 2019-04-17 | Discharge: 2019-05-18 | Disposition: E | Attending: Critical Care Medicine | Admitting: Critical Care Medicine

## 2019-04-19 DIAGNOSIS — Z933 Colostomy status: Secondary | ICD-10-CM

## 2019-04-19 DIAGNOSIS — I5033 Acute on chronic diastolic (congestive) heart failure: Secondary | ICD-10-CM

## 2019-04-19 DIAGNOSIS — E1165 Type 2 diabetes mellitus with hyperglycemia: Secondary | ICD-10-CM

## 2019-04-19 DIAGNOSIS — G9349 Other encephalopathy: Secondary | ICD-10-CM

## 2019-04-19 DIAGNOSIS — R7881 Bacteremia: Secondary | ICD-10-CM

## 2019-04-19 DIAGNOSIS — L03311 Cellulitis of abdominal wall: Secondary | ICD-10-CM

## 2019-04-19 DIAGNOSIS — Z87891 Personal history of nicotine dependence: Secondary | ICD-10-CM

## 2019-04-19 DIAGNOSIS — I214 Non-ST elevation (NSTEMI) myocardial infarction: Secondary | ICD-10-CM

## 2019-04-19 DIAGNOSIS — K219 Gastro-esophageal reflux disease without esophagitis: Secondary | ICD-10-CM

## 2019-04-19 DIAGNOSIS — J449 Chronic obstructive pulmonary disease, unspecified: Secondary | ICD-10-CM

## 2019-04-19 DIAGNOSIS — E1122 Type 2 diabetes mellitus with diabetic chronic kidney disease: Secondary | ICD-10-CM

## 2019-04-19 DIAGNOSIS — D631 Anemia in chronic kidney disease: Secondary | ICD-10-CM

## 2019-04-19 DIAGNOSIS — E785 Hyperlipidemia, unspecified: Secondary | ICD-10-CM

## 2019-04-19 DIAGNOSIS — B998 Other infectious disease: Secondary | ICD-10-CM

## 2019-04-19 DIAGNOSIS — L89316 Pressure-induced deep tissue damage of right buttock: Secondary | ICD-10-CM

## 2019-04-19 DIAGNOSIS — E875 Hyperkalemia: Secondary | ICD-10-CM

## 2019-04-19 DIAGNOSIS — K5669 Other partial intestinal obstruction: Secondary | ICD-10-CM

## 2019-04-19 DIAGNOSIS — Z79899 Other long term (current) drug therapy: Secondary | ICD-10-CM

## 2019-04-19 DIAGNOSIS — Z90710 Acquired absence of both cervix and uterus: Secondary | ICD-10-CM

## 2019-04-19 DIAGNOSIS — E872 Acidosis: Secondary | ICD-10-CM

## 2019-04-19 DIAGNOSIS — L89326 Pressure-induced deep tissue damage of left buttock: Secondary | ICD-10-CM

## 2019-04-19 DIAGNOSIS — N183 Chronic kidney disease, stage 3 (moderate): Secondary | ICD-10-CM

## 2019-04-19 DIAGNOSIS — Z1159 Encounter for screening for other viral diseases: Secondary | ICD-10-CM

## 2019-04-19 DIAGNOSIS — Z515 Encounter for palliative care: Secondary | ICD-10-CM

## 2019-04-19 DIAGNOSIS — J9601 Acute respiratory failure with hypoxia: Secondary | ICD-10-CM

## 2019-04-19 DIAGNOSIS — N179 Acute kidney failure, unspecified: Secondary | ICD-10-CM

## 2019-04-19 DIAGNOSIS — Z6841 Body Mass Index (BMI) 40.0 and over, adult: Secondary | ICD-10-CM

## 2019-04-19 DIAGNOSIS — L89152 Pressure ulcer of sacral region, stage 2: Secondary | ICD-10-CM

## 2019-04-19 DIAGNOSIS — I130 Hypertensive heart and chronic kidney disease with heart failure and stage 1 through stage 4 chronic kidney disease, or unspecified chronic kidney disease: Secondary | ICD-10-CM

## 2019-04-19 DIAGNOSIS — J9602 Acute respiratory failure with hypercapnia: Secondary | ICD-10-CM

## 2019-04-19 DIAGNOSIS — L03319 Cellulitis of trunk, unspecified: Secondary | ICD-10-CM

## 2019-04-19 DIAGNOSIS — Z9049 Acquired absence of other specified parts of digestive tract: Secondary | ICD-10-CM

## 2019-04-19 DIAGNOSIS — Z66 Do not resuscitate: Secondary | ICD-10-CM

## 2019-04-19 DIAGNOSIS — I469 Cardiac arrest, cause unspecified: Principal | ICD-10-CM

## 2019-04-19 DIAGNOSIS — K3184 Gastroparesis: Secondary | ICD-10-CM

## 2019-04-19 DIAGNOSIS — G931 Anoxic brain damage, not elsewhere classified: Secondary | ICD-10-CM

## 2019-04-19 LAB — CULTURE-RESP,LOWER W/SENSITIVITY: Lab: LOW

## 2019-04-19 LAB — URINALYSIS, MICROSCOPIC

## 2019-04-19 LAB — CULTURE-URINE W/SENSITIVITY

## 2019-04-19 LAB — URINALYSIS DIPSTICK
Lab: NEGATIVE
Lab: NEGATIVE
Lab: NEGATIVE
Lab: NEGATIVE

## 2019-04-19 LAB — GRAM STAIN

## 2019-04-19 LAB — PROCALCITONIN: Lab: 67 ng/mL — ABNORMAL HIGH (ref <0.11)

## 2019-04-19 LAB — BASIC METABOLIC PANEL
Lab: 11 mL/min — ABNORMAL LOW (ref >60)
Lab: 13 mg/dL — ABNORMAL HIGH (ref 3–12)
Lab: 137 MMOL/L — ABNORMAL HIGH (ref 137–147)
Lab: 5.7 MMOL/L — ABNORMAL HIGH (ref 3.5–5.1)
Lab: 8.3 mg/dL — ABNORMAL LOW (ref 8.5–10.6)
Lab: 9 mL/min — ABNORMAL LOW (ref >60)

## 2019-04-19 LAB — FUNGITELL: Lab: <31

## 2019-04-19 LAB — PTT (APTT)
Lab: 35 s (ref 24.0–36.5)
Lab: 51 s — ABNORMAL HIGH (ref 24.0–36.5)
Lab: 50 s — ABNORMAL HIGH (ref 24.0–36.5)

## 2019-04-19 LAB — LACTIC ACID (BG - RAPID LACTATE): Lab: 1.1 MMOL/L (ref 0.5–2.0)

## 2019-04-19 LAB — COMPREHENSIVE METABOLIC PANEL: Lab: 135 MMOL/L — ABNORMAL LOW (ref 137–147)

## 2019-04-19 LAB — MAGNESIUM
Lab: 1.7 mg/dL — ABNORMAL HIGH (ref >60)
Lab: 1.7 mg/dL — ABNORMAL LOW (ref >60)

## 2019-04-19 LAB — TROPONIN-I: Lab: 2.9 ng/mL — ABNORMAL HIGH (ref 0.0–0.05)

## 2019-04-19 LAB — ASPERGILLUS (GALACTOMANNAN) AG: Lab: 0

## 2019-04-19 LAB — PROTIME INR (PT): Lab: 1.3 M/UL — ABNORMAL HIGH (ref >60)

## 2019-04-19 LAB — BLOOD GASES, ARTERIAL: Lab: 7.3 g/dL — ABNORMAL LOW (ref 7.35–7.45)

## 2019-04-19 LAB — PHOSPHORUS
Lab: 5 mg/dL — ABNORMAL HIGH (ref >60)
Lab: 6.3 mg/dL — ABNORMAL HIGH (ref >60)

## 2019-04-19 LAB — CBC AND DIFF: Lab: 12 10*3/uL — ABNORMAL HIGH (ref 4.5–11.0)

## 2019-04-19 MED ORDER — GLYCOPYRROLATE 0.2 MG/ML IJ SOLN
0.4 mg | Freq: Once | INTRAVENOUS | 0 refills | Status: CP
Start: 2019-04-19 — End: ?
  Administered 2019-04-19: 23:00:00 0.4 mg via INTRAVENOUS

## 2019-04-19 MED ORDER — DEXTRAN 70-HYPROMELLOSE (PF) 0.1-0.3 % OP DPET
1 [drp] | OPHTHALMIC | 0 refills | Status: DC | PRN
Start: 2019-04-19 — End: 2019-04-20

## 2019-04-19 MED ORDER — SODIUM ZIRCONIUM CYCLOSILICATE 10 GRAM PO PWPK
10 g | Freq: Once | ORAL | 0 refills | Status: CP
Start: 2019-04-19 — End: ?
  Administered 2019-04-19: 13:00:00 10 g via ORAL

## 2019-04-19 MED ORDER — FUROSEMIDE 10 MG/ML IJ SOLN
80 mg | Freq: Once | INTRAVENOUS | 0 refills | Status: CP
Start: 2019-04-19 — End: ?
  Administered 2019-04-19: 17:00:00 80 mg via INTRAVENOUS

## 2019-04-19 MED ORDER — ARTIFICIAL SALIVA (YERBAS-LYT) MM SPRA
1 | OROMUCOSAL | 0 refills | Status: DC | PRN
Start: 2019-04-19 — End: 2019-04-20

## 2019-04-19 MED ORDER — LORAZEPAM 2 MG/ML IJ SOLN
1-2 mg | INTRAVENOUS | 0 refills | Status: DC | PRN
Start: 2019-04-19 — End: 2019-04-20
  Administered 2019-04-19: 23:00:00 2 mg via INTRAVENOUS

## 2019-04-19 MED ORDER — BISACODYL 10 MG RE SUPP
10 mg | Freq: Every day | RECTAL | 0 refills | Status: CN | PRN
Start: 2019-04-19 — End: ?

## 2019-04-19 MED ORDER — FENTANYL CITRATE (PF) 50 MCG/ML IJ SOLN
25-50 ug | INTRAVENOUS | 0 refills | Status: DC | PRN
Start: 2019-04-19 — End: 2019-04-20
  Administered 2019-04-19: 23:00:00 50 ug via INTRAVENOUS

## 2019-04-19 MED ORDER — GLYCOPYRROLATE 0.2 MG/ML IJ SOLN
.4-.8 mg | INTRAVENOUS | 0 refills | Status: DC | PRN
Start: 2019-04-19 — End: 2019-04-20

## 2019-04-19 MED ORDER — METOLAZONE 5 MG PO TAB
5 mg | Freq: Every day | ORAL | 0 refills | Status: DC
Start: 2019-04-19 — End: 2019-04-19
  Administered 2019-04-19: 17:00:00 5 mg via ORAL

## 2019-04-19 NOTE — Progress Notes
Critical Care   Progress Note       Name:  Alexandria Morgan                                             MRN:  [id number]   Admission Date:  05/05/2019    Principal Problem:    Cardiac arrest [facility])  Active Problems:    Leukocytosis    Acute respiratory failure with hypoxia and hypercapnia (HCC)    Encephalopathy    Seizure (HCC)    Myocardial injury    Acute on chronic heart failure with preserved ejection fraction (HFpEF) (HCC)    Elevated d-dimer    Acute abdominal pain    Acute kidney injury superimposed on CKD (HCC)    Hyperkalemia    Hypomagnesemia    Lactic acidosis    Hematuria    Cellulitis    Intra-abdominal infection                       Assessment and Plan     Hospital Course:???Vista Lawman???is a 58 y/o???female???with history of COPD, HTN, HLD, DM,???diverticulitis s/p colostomy,???and recently incarcerated???parastomal???hernia s/p repair (08/2018)???c/b???wound infection in December (discharged 12/19); presents???with shortness of breath and???abdominal pain???7/1.???CXR w/ volume overload; BNP 58,500. Other laboratory findings remarkable for: Hyperkalemia (K 7.4), AKI (Cr 3.7), trop 0.49, lactic acid 1.6, VBG 7.18/44. Suffered spontaneous cardiac arrest first w/ asystole, followed by PEA in ED; ROSC after ~45 minutes. Cardiology consulted. Likely etiology: acute hypoxia, acidosis, PE, and/or hyperkalemia. Intubated in ED. Admitted to MICU post-cardiac arrest on mechanical ventilation. CT chest w/ pulmonary edema and bilateral consolidative opacities. Renal consulted for emergent HD given volume overload and hyperkalemia. CT ap w/ concern for???closed loop type obstruction. General surgery consulted; no indication for surgical intervention given overall instability and high surgical risk. Neurology consulted for myoclonic movements and poor neurological examination.  vEEG and CT head findings consistent with anoxic injury in the setting of cardiac arrest. Ongoing hyperkalemia and worsening AKI, now with spontaneous urine output. Family declined further HD treatment 7/2 and had planned to transition to CMO.  Palliative Care consulted for goals of care assistance.   ???  NEURO:???  Encephalopathy  Seizure like activity  C/f Anoxic injury s/p cardiac arrest   - No induction agents utilized w/ intubation  - No sedation gtts upon arrival to MICU  - CT head 7/1 unremarkable  - vEEG 7/3: Continuous delta range activity suggestive of neuronal dysfunction which could be secondary to toxic, metabolic, infective, drug related, hypothermia related to hypoxic etiologies. Compared to yesterday, cortical myoclonus or burst suppression pattern are no longer visible  - Negative gag, cough, corneals, pupils fixed; overbreathing MV  Plan  - Neurology consulted  - cvEEG   - Fentanyl and Propofol gtts off since 2350 7/1   - PRN Ativan for seizure like activity  - MRI held as no further w/u necessary with vEEG data and clinical findings  - Palliative care consulted for goals of care given poor prognosis   H/o anxiety/depression  ? Parkinsonian Dx  - Hold PTA Buspar 7.5 mg BID???& Sertraline 50mg  D  - Hold PTA Requip  ???  CV:???  S/p???Cardiac Arrest  -???First w/ asystole, then PEA arrest for ~45 minutes in ED; s/p 6mg  epinephrine, 550 mg Na HCO3, 5g Ca+ gluconate, D50  - Likely d/t hyperkalemia???+/- acidosis +/-  hypoxia +/- PE (w/ elevated DDimer)  Myocardial Injury   Troponin Elevation???(uptrending)  -???DDx: 2/2???pulmonary embolism (given recent history of progressively worsening shortness of breath and an elevated d-dimer on admission)???vs recent cardiac arrest???  - EKG on admit:???Average heart rate 95, QTc 582 with left bundle branch morphology. ???No evidence of acute ST segment/T wave changes  - Trop 2.99 7/3  Acute on Chronic HFpEF???  -???Echo 10/19/18???w/ EF of 60%, normal diastolic function, no significant valvular abnormalities  - BNP 2500 on admit  - PTA???Lasix 40 mg D & Spironolactone 50 mg D??? - CT chest 7/1: moderate cardiomegaly and pulmonary congestion c/w CHF and/or volume overload  - Echo 7/2: mildly reduced LVEF 45%, LV mildly dilated w/ septal hypokinesis, abnormal septal motion c/w LBBB, grade 1 diastolic dysfunction, mild mitral annular calcification w/ trace regurg, trace tricuspid regurg, PSPAP 42 mm Hg; negative for intracardiac shunt  H/o HTN  H/o HLD  - PTA???Amlodipine 10mg  D,???Atorvastatin 40mg  D, Carvedilol 25 mg BID  - Lipid panel 7/1 WNL, except low HDL  - Currently SBP 180-110s, SR 80-100s  Plan  - Cardiology consulted, appreciate recs  - Trend trops???q6h ?????????  -???See diuresis plan below  -???Continue PTA Atorvastatin 20mg  D, ASA 81 mg D?????????  - Hold PTA Spironolactone & Lasix,???Amlodipine,???Carvedilol   - PRN Hydralazine 10mg  IV (used 1x overnight)  ???  PULM:???  Acute???hypoxic and???hypercapnic???respiratory failure  Acute pulmonary edema   Shortness of breath (CC)  - Likely d/t volume overload???  - CXR???7/1:???Pulmonary edema  - CT chest 7/1: volume overload; patchy consolidative tree-in-bud opacities t/o both lungs (R>L) s/o atypical pneumonia  - Current???MV settings: V/AC TV 450, RR 20, P+5, FiO2???40 - PIPs 29-35  -???Most recent ABG: 7.34 /???50 / 160 / 25.1  Plan  - Increase RR 22  - Daily ABG???  - Volume management below  - PRN Duo-nebs  Elevated D-Dimer  - Ddimer 2136  - BLLE doppler 7/1: negative for DVT  H/o COPD  - PTA???ProAir q4h PRN  H/o Right lower lobe mass???  - CT chest 7/1: ill-defined subcentimeter nodule in the RLL stable since Nov 2019  - Recommend f/u outpatient  ???  GI:???  -???7/3 AST 68, ALT 27, ALP 59, TB 1.1  Acute abdominal pain  S/p Incarcerated Hernia Repair???  Colostomy???  - 08/28/18???underwent exploratory laparotomy, LOA, reduction of internal hernia, partial small bowel resection x2, parastomal hernia repair???and ostomy placement???with Dr. Mayford Knife for  incarcerated parastomal hernia and internal hernia for which she underwent an e -???Re-admitted 10/02/18???for surgical site infection treated w/ IR drainage and Zosyn  - Presented with acute abdominal pain this hospitalization   - CT AP 7/1:???Development of mildly dilated small bowel within the left moderate size parastomal hernia consistent with mechanical small bowel obstruction, with the transition points at the stomal neck; could be closed loop type obstruction.??????Mildly dilated short segment of bowel loop in the central anterior abdomen abutting the anterior abdominal wall with additional adjacent normal caliber bowel loops abutting the anterior abdominal wall. No definite transition point identified. Mild???hepatosplenomegaly.   - Colostomy with no documented stool output in 24h  Plan  - General surgery consulted w/ c/f closed loop type obstruction - currently too medically unstable to tolerate procedure + poor surgical candidate at baseline; will continue following  - Continue bowel regimen???  Morbid obesity   - BMI 58.68  GERD  H/o Gastroparesis   -???Esophagram 1/9 & EGD 1/10 done w/ findings c/w gastroparesis but no stricture or mass effect  causing gastroparesis seen??????  - PTA Reglan???10mg  TID, Meclizine 12.5mg  BID, Bentyl 20mg  TID  Plan  - Continue PPI  ???  RENAL:???  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  Hypomagnesemia???  - 7/3???BUN 74???Cr 5.24???K 5.7 Na???135 Mag 1.7  -???Baseline Cr is 1.4-2  - Urine studies with intrinsic injury likely associated w/ cardiac arrest  - S/p HD 7/1  - I/O: 1L u/o in 24hr; +53mL in 24hr   Plan  - Renal consulted, appreciate recs???  - Suspect diuresis phase of ATN  - Continue to medicinally manage hyperkalemia while holding HD per family   - Lasix 80 mg IV + 5 mg Metolazone given making urine now  Lactic Acidosis (improved)  - 1.6 ???>>???2.1 >>???2.8 >> 1.3  Hematuria (resolved)  - Suspect d/t traumatic catheter placmeent???  ???  ENDO:???  DM2???  Hyperglycemia  - SBG 100s   - A1c 5.2  Plan  - Hold PTA Victoza   - Hold CF while NPO  ???  ID:???  Leukocytosis (improving)   Bacteremia UTI   CAP  C/f intra-abdominal source  LLQ cellulitis  -???7/3???WBC???12;???39.3 tmax  -???Prior cx data: PSAE LLQ wound resistant to???Trimethsulfa???and intermediate resistance to???Ceftazidime???& Zosyn???  - CXR???as above  - CT c/a/p as above  - BC???7/1 gram + cocci resembling staph; cx pending ???  - BC 7/2 ngtd  - SC???7/1 gram + cocci, few gram - rods; cx pending  - UA???7/1 5-10 WBC, 1+ leuks, moderate bacteria; cx pending  - PCT???7/1 15.8  - Covid-19???7/1 negative  -???Fungitell & galactomannan???7/1 pending  Plan  - Cover w/ Vanc, Cefepime and Flagyl???  - Suspect fever CNS related  - F/u cultures  - Wound consulted   ???  HEME:???  Normocytic???Anemia  -???7/3???Hgb 12???Plt???101  - Anemia since surgery for diverticulitis in 2019???  - Previous w/u determine???chronic disease and anemia of CKD  - PTA Epo 10000 q7D  Plan  -???Transfuse for <8???Hgb  ???  SKIN:  Right frank cellulitis   Mid chest skin tear  Right upper arm ecchymosis  Coccyx Stage 2  Left buttocks deep tissue pressure injury  Right buttocks deep tissue pressure injury   - Wound team consulted; recs placed  ???  ???  Prophylaxis Review:  Lines:???Central Line, Dialysis Catheter and Peripheral Line  Tubes:???OG, ETT  Diet:???No  Insulin:???No  Urinary Catheter:???Yes  VTE ppx:???Hep gtt; SCDs  GI ppx:???PPI  PT/OT:???Yes  ???  Code status:???DNAR-FI  Disposition:???Continue in MICU. Will follow up with family regarding transitioning to CMO.   ???  -???[id number].???Pt critically ill with the above diagnoses. I spent 50 minutes providing critical care services including:  ??? reviewing outside records and obtaining history from the patient/family members  ??? performing a physical examination  ??? serially reviewing laboratory, telemetry, hemodynamic, oximetry, and respiratory data  ??? reviewing radiographic images  ??? reviewing medications  ??? managing fluids/electrolytes, antibiotics, ICU prophylaxis, and mechanical ventilation   ??? developing the overall plan of care  ???  Starlyn Skeans, APRN   Pulm/Critical Care  Pager [phone] Available on Voalte and Cureatr.  ???  Pt seen and discussed w/ Dr.???Latham.???  ???  M3 team pager (2nd call/nights) [phone]  ___________________________________________________________________________  Subjective:     Review of systems not obtained due to patient factors.    Objective:   Medications:  Scheduled Meds:aspirin chewable tablet 81 mg, 81 mg, Oral, QDAY  atorvastatin (LIPITOR) tablet 20 mg, 20 mg, Oral, QHS  cefepime (MAXIPIME) 2 g in sodium chloride 0.9% (NS) 100  mL IVPB (MB+), 2 g, Intravenous, Q24H*  chlorhexidine gluconate (PERIDEX) 0.12 % solution 15 mL, 15 mL, SEE ADMIN INSTRUCTIONS, BID(8-20)  dextran 70/hypromellose (GENTEAL TEARS) ophthalmic solution 1 drop, 1 drop, Both Eyes, QID  folic acid (FOLVITE) tablet 1 mg, 1 mg, Oral, QDAY  heparin (porcine) injection 3,500-7,000 Units, 20-40 Units/kg, Intravenous, As Prescribed  metroNIDAZOLE (FLAGYL) 500 mg IVPB 100 mL, 500 mg, Intravenous, Q8H*  pantoprazole (PROTONIX) injection 40 mg, 40 mg, Intravenous, QDAY  polyethylene glycol 3350 (MIRALAX) packet 17 g, 1 packet, Oral, BID  senna/docusate (SENOKOT-S) tablet 2 tablet, 2 tablet, Oral, BID  vancomycin, random dosing, 1 each, Intravenous, Random Dosing    Continuous Infusions:  ??? fentaNYL (SUBLIMAZE)  1000 mcg/100 mL NS IV drip (std conc)(premade) Stopped (04/30/2019 2350)   ??? heparin (porcine) 20,000 units/D5W 500 mL infusion (std conc)(premade) 1,600 Units/hr (04/18/19 2334)     PRN and Respiratory Meds:albuterol 0.5% Q4H PRN **AND** ipratropium bromide Q4H PRN, alteplase PRN (On Call from Rx), anticoagulant sodium citrate QDAY PRN, hydrALAZINE Q4H PRN, LORazepam  (ATIVAN)  injection PRN, naloxone PRN, [DISCONTINUED] vancomycin  (0-40 kg) IV Q12H* **AND** vancomycin, pharmacy to manage Per Pharmacy                       Vital Signs: Last Filed                  Vital Signs: 24 Hour Range   BP: 120/50 (07/03 0500)  Temp: 39.2 ???C (102.6 ???F) (07/03 0500)  Pulse: 89 (07/03 0500) Respirations: 18 PER MINUTE (07/03 0500)  SpO2: 100 % (07/03 0500)  SpO2 Pulse: 89 (07/03 0500)  Height: 173 cm (68.11) (07/02 1000) BP: (107-177)/(33-83)   Temp:  [37.4 ???C (99.3 ???F)-39.3 ???C (102.7 ???F)]   Pulse:  [68-105]   Respirations:  [0 PER MINUTE-33 PER MINUTE]   SpO2:  [100 %]      Vitals:    05/13/2019 1507 05/04/2019 1715 04/18/19 1000   Weight: (!) 175 kg (385 lb 12.9 oz) (!) 175 kg (385 lb 12.9 oz) (!) 174.6 kg (385 lb)           Intake/Output Summary:  (Last 24 hours)    Intake/Output Summary (Last 24 hours) at May 16, 2019 0649  Last data filed at May 16, 2019 0600  Gross per 24 hour   Intake 1652.56 ml   Output 1560 ml   Net 92.56 ml         Physical Exam:      General:??????Unarousable on MV, appears???older then???stated age  Head: ???Normocephalic, without obvious abnormality, atraumatic  Eyes: ???Conjunctivae/corneas clear. ???PERRL  Mouth/throat: Moist mucous membranes. ETT???and OG tube in place.???  Neck: ???Supple, symmetrical, trachea midline  Lungs:??????Diminished breath sounds throughout. No wheeze. Overbreathing MV.   Heart: ???Regular rate and rhythm, S1, S2 normal, no murmur appreciated  Abdomen: ???Obese, soft, non-tender. ???LLQ colostomy without output.???Bowel sounds normal  Extremities: ???Extremities normal, atraumatic, no cyanosis. Trace LE pulmonary edema???  Pulses: ??????2+ and symmetric, all extremities  Skin:?????????Multiple skin changes as described above and documented by RN staff in further detail  Neurologic:??????Unresponsive with no sedation on MV. Brain stem reflexes not intact, overbreathing mechanical ventilation. ???  ???  Artificial airway:  Endotracheal Tube  Ventilator/ Respiratory Therapy:  Yes: Weaning readiness screen (RT Only):: Implement protocol  Weaning readiness screen Met (RT Only):: Other (see comments)  Mode: V/AC+  Set Vt (ml):  [450 milliliters] Expired Tidal Volume Spont (mL):  [364 milliliters-521 milliliters]   Set RR:  [22 breaths/minutes]   Total Respiratory Rate (Breaths/Min):  [24 breaths/minutes-26 breaths/minutes]   Minute Volume (L/min):  [10.9 liters/minutes-12.4 liters/minutes]   %MVspon:  [0 %-4 %]   O2%:  [40 %]   PIP Actual:  [31 cm H20-41 cm H20]   PEEP/CPAP:  [5 cm H2O]      Vent weaning trial:  Per protocol    Laboratory:  LABS:  Recent Labs     05/14/2019  0859 05/13/2019  1208 05/14/2019  1822 04/28/2019  2321 04/18/19  0425 04/18/19  0605 04/18/19  1213 04/18/19  1811 05/19/19  0005 05-19-2019  0525   NA 130* 144 138 137 137 135* 136* 134* 137 135*   K 7.4* 6.9* 5.3* 5.6* 5.9* 6.0* 6.2* 5.6* 5.7* 5.7*   CL 106 108 100 100 100 99 99 98 99 98    CO2 13* 26 25 25 26 25 25 25 25 27    GAP 11 10 13* 12 11 11 12 11  13* 10   BUN 59* 63* 46* 50* 54* 56* 60* 65* 66* 74*   CR 3.70* 3.89* 2.98* 3.57* 3.75* 3.87* 4.62* 4.61* 5.08* 5.24*   GLU 134* 105* 128* 158* 179* 176* 158* 172* 167* 148*   CA 8.8 8.9 8.9 8.4* 8.4* 8.5 8.5 8.2* 8.3* 8.0*   ALBUMIN 4.2 3.4*  --   --  3.3*  --   --   --   --  3.3*   MG  --  1.2* 1.5* 1.4* 1.6 1.7 1.8 1.7 1.7 1.7   PO4  --  5.9* 3.7 4.3 4.5 4.3 4.8* 4.8* 5.0* 6.3*   HGBA1C  --  5.2  --   --   --   --   --   --   --   --        Recent Labs     05/04/2019  0859 04/18/2019  1208 05/02/2019  1822 04/23/2019  2321 04/18/19  0425 04/18/19  0605 04/18/19  1250 04/18/19  2220 05-19-19  0525   WBC 12.4* 10.8  --   --  10.7  --   --   --  12.0*   HGB 8.7* 9.1*  --   --  8.1*  --   --   --  7.6*   HCT 26.6* 26.6*  --   --  23.2*  --   --   --  22.4*   PLTCT 129* 107*  --   --  87*  --   --   --  101*   INR 1.4* 1.5*  --   --  1.5*  --   --   --  1.3*   PTT 24.6 30.3  --  37.8* 43.4* 38.1* 34.7 35.4 51.9*   AST 29 70*  --   --  58*  --   --   --  68*   ALT 21 41  --   --  33  --   --   --  27   ALKPHOS 84 88  --   --  68  --   --   --  59   TNI  --  0.58* 0.93* 1.03*  --  1.32*  --   --  2.99* Estimated Creatinine Clearance: 20 mL/min (A) (based on SCr of 5.24 mg/dL (H)).  Vitals:    05/07/2019 1507 05/16/2019 1715 04/18/19 1000   Weight: (!) 175 kg (385 lb 12.9 oz) (!) 175 kg (385 lb 12.9 oz) (!) 174.6 kg (385 lb)      Recent Labs     04/18/19  0958 05-04-19  0535   PHART 7.46* 7.34*   PO2ART 179* 160*           Radiology and Other Diagnostic Procedures Review:    Reviewed

## 2019-04-19 NOTE — Procedures
VIDEO EEG REPORT    Alexandria Morgan  [birth year]  [id number]    Date of service: 04/18/2019    History: This is a 58 y.o. female presenting with cardiac arrest and myoclonic jerks.       Current Facility-Administered Medications:   ???  albuterol 0.5% (PROVENTIL) nebulizer solution 2.5 mg, 2.5 mg, Inhalation, Q4H PRN **AND** ipratropium bromide (ATROVENT) 0.02 % nebulizer solution 0.5 mg, 0.5 mg, Inhalation, Q4H PRN, Jones, Hadleigh S, APRN-NP  ???  alteplase (CATHFLO ACTIVASE) injection 2 mg, 2 mg, Intra-catheter, PRN (On Call from Rx), Theodoro Kalata, MD  ???  anticoagulant sodium citrate solution 1-6 mL, 1-6 mL, Intra-catheter, QDAY PRN, Theodoro Kalata, MD, 2.8 mL at 19-Apr-2019 1519  ???  aspirin chewable tablet 81 mg, 81 mg, Oral, QDAY, Jones, Hadleigh S, APRN-NP, 81 mg at 04/18/19 0844  ???  atorvastatin (LIPITOR) tablet 20 mg, 20 mg, Oral, QHS, Jones, Hadleigh S, APRN-NP, 20 mg at 04/18/19 2106  ???  cefepime (MAXIPIME) 2 g in sodium chloride 0.9% (NS) 100 mL IVPB (MB+), 2 g, Intravenous, Q24H*, Provider, Pharmacy, Last Rate: 200 mL/hr at 04/18/19 1422, 2 g at 04/18/19 1422  ???  chlorhexidine gluconate (PERIDEX) 0.12 % solution 15 mL, 15 mL, SEE ADMIN INSTRUCTIONS, BID(8-20), Starlyn Skeans S, APRN-NP, 15 mL at 04/18/19 2104  ???  dextran 70/hypromellose (GENTEAL TEARS) ophthalmic solution 1 drop, 1 drop, Both Eyes, QID, Theodoro Kalata, MD, 1 drop at 04/18/19 2106  ???  fentaNYL (SUBLIMAZE)  1000 mcg/100 mL NS IV drip (std conc)(premade), 25-100 mcg/hr, Intravenous, TITRATE, Jones, Cristopher Peru, APRN-NP, Stopped at 19-Apr-2019 2350  ???  folic acid (FOLVITE) tablet 1 mg, 1 mg, Oral, QDAY, Jones, Hadleigh S, APRN-NP, 1 mg at 04/18/19 0844  ???  heparin (porcine) 20,000 units/D5W 500 mL infusion (std conc)(premade), 0-2,000 Units/hr, Intravenous, TITRATE, Orlan Leavens, APRN-NP, Last Rate: 40 mL/hr at 04/18/19 2334, 1,600 Units/hr at 04/18/19 2334  ???  heparin (porcine) injection 3,500-7,000 Units, 20-40 Units/kg, Intravenous, As Prescribed, Orlan Leavens, APRN-NP, 7,000 Units at 04/18/19 2330  ???  hydrALAZINE (APRESOLINE) injection 10 mg, 10 mg, Intravenous, Q4H PRN, Starlyn Skeans S, APRN-NP, 10 mg at 04/18/19 2137  ???  LORazepam (ATIVAN) injection 2 mg, 2 mg, SEE ADMIN INSTRUCTIONS, PRN, Yetta Barre, Hadleigh S, APRN-NP  ???  metroNIDAZOLE (FLAGYL) 500 mg IVPB 100 mL, 500 mg, Intravenous, Q8H*, Jones, Hadleigh S, APRN-NP, 500 mg at 04/21/2019 0603  ???  naloxone (NARCAN) injection 0.08 mg, 0.08 mg, Intravenous, PRN, Orlan Leavens, APRN-NP  ???  pantoprazole (PROTONIX) injection 40 mg, 40 mg, Intravenous, QDAY, Jones, Hadleigh S, APRN-NP, 40 mg at 04/18/19 0843  ???  polyethylene glycol 3350 (MIRALAX) packet 17 g, 1 packet, Oral, BID, Orlan Leavens, APRN-NP, 17 g at 04/18/19 2105  ???  senna/docusate (SENOKOT-S) tablet 2 tablet, 2 tablet, Oral, BID, Jones, Hadleigh S, APRN-NP, 2 tablet at 04/18/19 2105  ???  [DISCONTINUED] vancomycin (VANCOCIN) 1,750 mg in dextrose 5% (D5W) IVPB (15-40 kg), 10 mg/kg, Intravenous, Q12H* **AND** vancomycin, pharmacy to manage, 1 each, Service, Per Pharmacy, Orlan Leavens, APRN-NP  ???  vancomycin, random dosing, 1 each, Intravenous, Random Dosing, Provider, Pharmacy    Introduction:  This study was performed using digital electroencephalographic recording equipment. International 10-20 electrode placement was used along with FT9 and FT10 electrodes.     Description:   In the most stimulated state the background consisted of continuous waveforms with appearance of delta range ( 1 - 3 Hz) rhythmic activity mixed  with myogenic artifact    There is no reactivity in the recording.   No periods of bursts suppression or myoclonic jerks were noticed in the recording.     Technical interpretation:   This EEG is abnormal due to   - More continuous delta range activity.  - No seizures are seen.     Clinical correlation;   This vEEG in comatose state is abnormal - Continuous delta range activity suggestive of neuronal dysfunction which could be secondary to toxic, metabolic, infective, drug related, hypothermia related to hypoxic etiologies.  - Compared to yesterday, cortical myoclonus or burst suppression pattern are no longer visible.    This report reflects the recording period from 0901 on 04/18/19 to 0700 on 7/320.    Winona Legato, MD

## 2019-04-19 NOTE — Progress Notes
MICU Critical Care Progress Note          Alexandria Morgan  Admission Date: 04/21/2019  LOS: 2 days                     Assessment/Plan:   Principal Problem:    Cardiac arrest [facility])  Active Problems:    Leukocytosis    Acute respiratory failure with hypoxia and hypercapnia (HCC)    Encephalopathy    Seizure (HCC)    Myocardial injury    Acute on chronic heart failure with preserved ejection fraction (HFpEF) (HCC)    Elevated d-dimer    Acute abdominal pain    Acute kidney injury superimposed on CKD (HCC)    Hyperkalemia    Hypomagnesemia    Lactic acidosis    Hematuria    Cellulitis    Intra-abdominal infection      Patient is critically ill secondary to the above diagnoses, but primarily concerned for the following:    1.  Status post cardiac arrest asystole followed by PEA most likely secondary to metabolic derangements and perhaps complicated by hypoxemia  2.  Acute hypoxemic respiratory failure with hypercapnia due to cardiac arrest  3.  Acute renal failure with hyperkalemia  4.  Acute encephalopathy with findings consistent with severe anoxic brain injury  5.  Possible closed-loop small bowel obstruction    Exam:  Vitals and I/O's reviewed  Neuro-patient unresponsive to verbal or tactile stimulation.  No gag reflex, no pupillary reflex  HEENT: ET tube in place  Neck: No lymphadenopathy  Chest-decreased throughout but no wheezing  CV-regular rhythm and rate  Abd-morbidly obese with colostomy without acute changes  Ext- no cyanosis or clubbing.  Mild bilateral lower extremity edema  Skin- no rashes      I spent 40 minutes evaluating the patient including personally reviewing images, labs, progress and consult notes, discussing with nursing, and examining patient.  Plan of care includes the following:     ??? Continue neurologic findings consistent with severe anoxic brain injury including now having hyperthermia. ??? Continue mechanical ventilation to optimize oxygenation and ventilation.  ??? Continue current antimicrobials and follow-up cultures.  ???Did make urine overnight.  Will give Lasix for hyperkalemia.  ??? Met with family again today.  She has been made to transition to comfort measures only.        Theodoro Kalata, MD

## 2019-04-19 NOTE — Progress Notes
EEG signals are clean of artifact and visible.  Video was visualized and recording.  Audio recording was checked and functioning.  The VEEG system is on-line.  Central monitoring station is functioning appropriately.      The event button is operational and within reach of the patient and/or patient's family.    All impedances were below 10,000 Ohms.

## 2019-04-19 NOTE — Progress Notes
Patient disconnected from VEEG monitoring without complications.

## 2019-04-21 LAB — CULTURE-RESP,LOWER W/SENSITIVITY: Lab: LOW

## 2019-04-22 LAB — CULTURE-BLOOD W/SENSITIVITY

## 2019-04-24 LAB — CULTURE-BLOOD W/SENSITIVITY

## 2019-04-25 LAB — CULTURE-BLOOD W/SENSITIVITY

## 2019-04-27 IMAGING — CT ABDOMEN_PELVIS WO(Adult)
2 of 3 series · 14 of 46 positions shown, 16 images · non-contrast
Comparison: none

PROCEDURE: ABDOMEN_PELVIS WO(Adult)
HISTORY: abd pain x 3 days. AW
TECHNIQUE: Axial CT imaging of the abdomen and pelvis was performed without contrast.

[Series 2: abdomen ax 5.00 br40 s3 · axial · 0.79mm/px · z∈[+1342,+1757]mm · 11 of 94 slices shown, 13 images]
[im 7/94  soft-tissue]
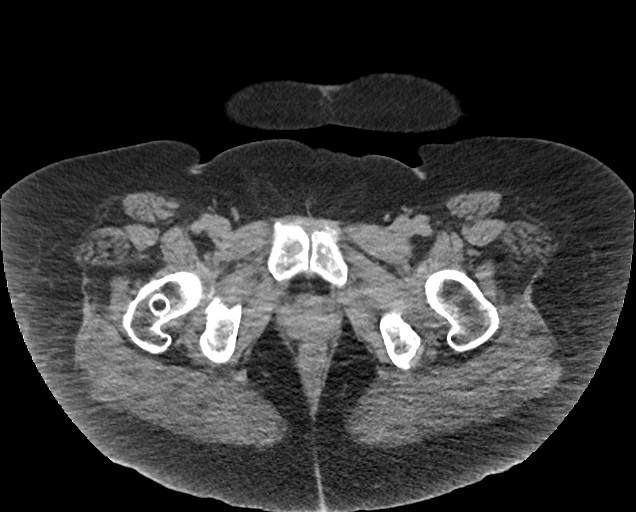
[im 7/94  bone]
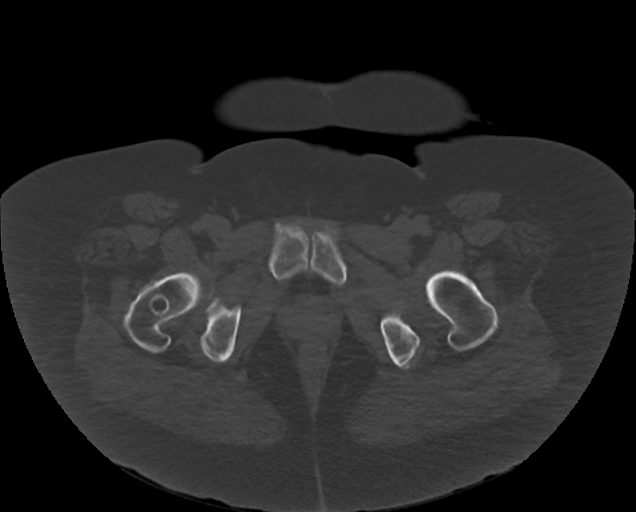
[im 16/94  soft-tissue]
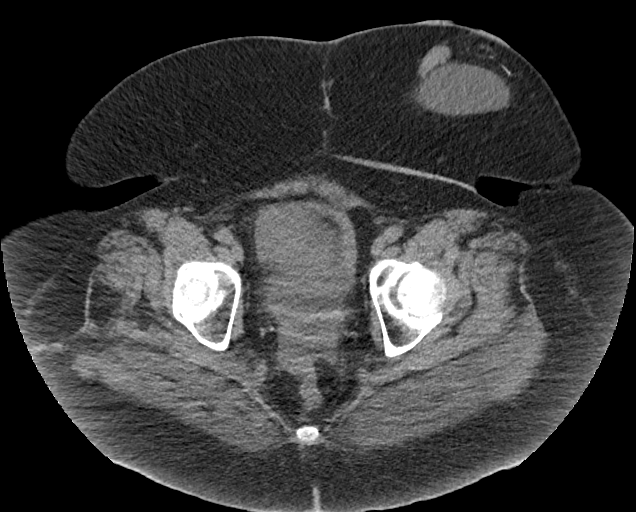
[im 22/94  soft-tissue]
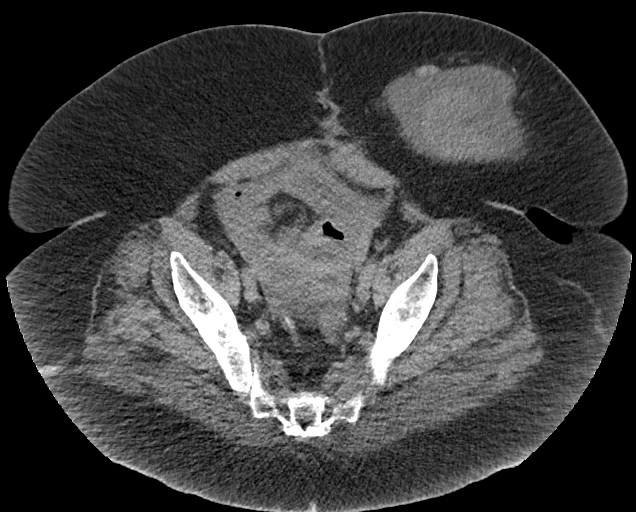
[im 31/94  soft-tissue]
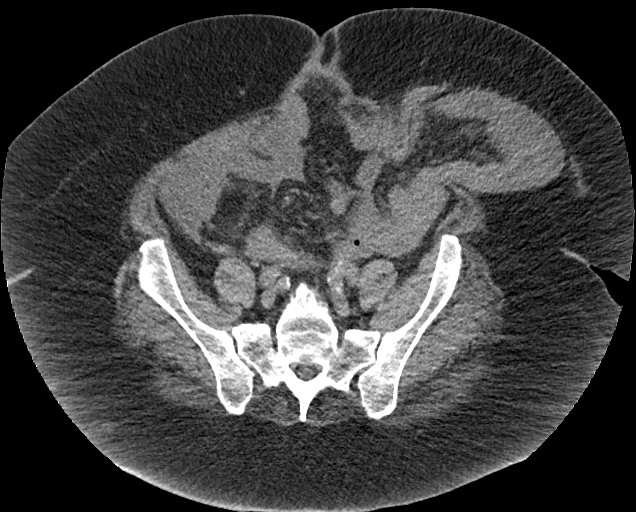
[im 40/94  soft-tissue]
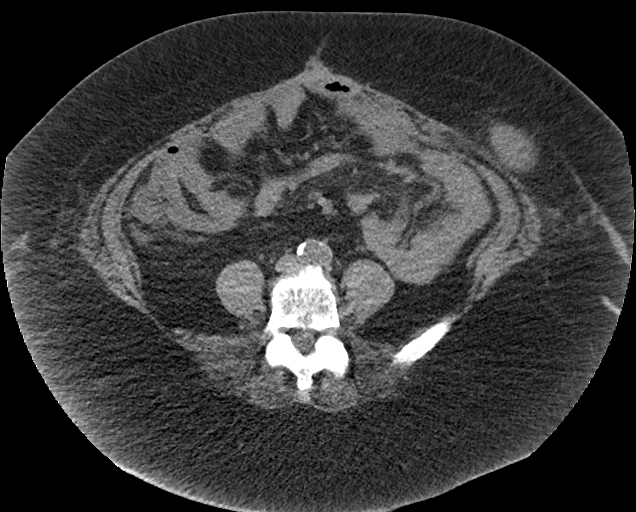
[im 49/94  soft-tissue]
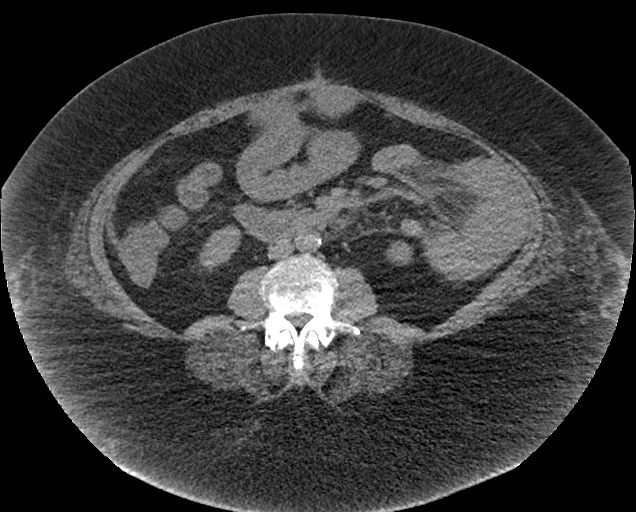
[im 55/94  soft-tissue]
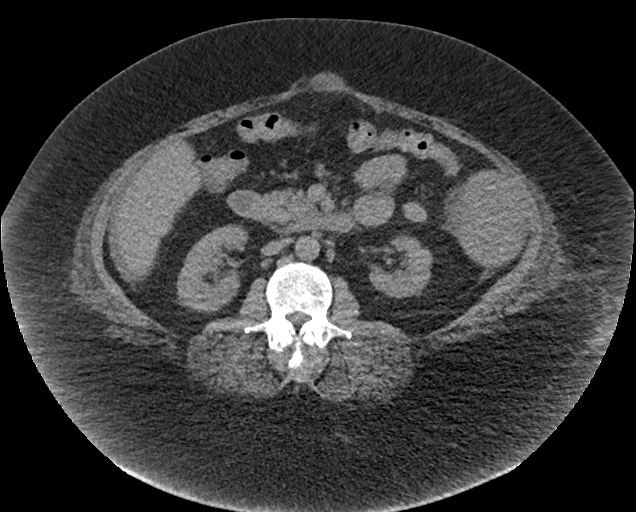
[im 64/94  soft-tissue]
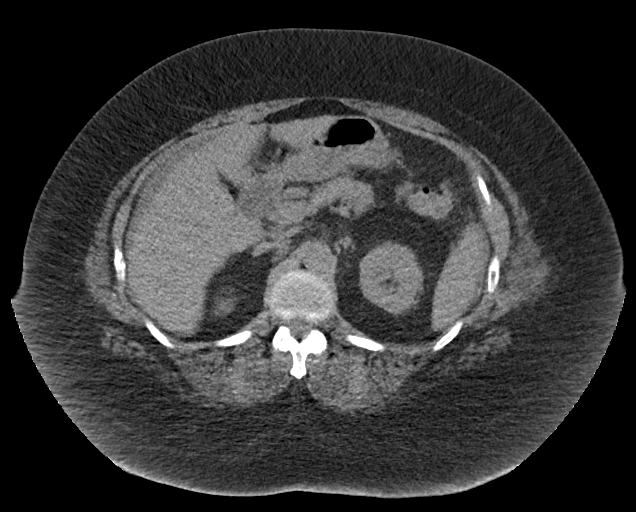
[im 73/94  soft-tissue]
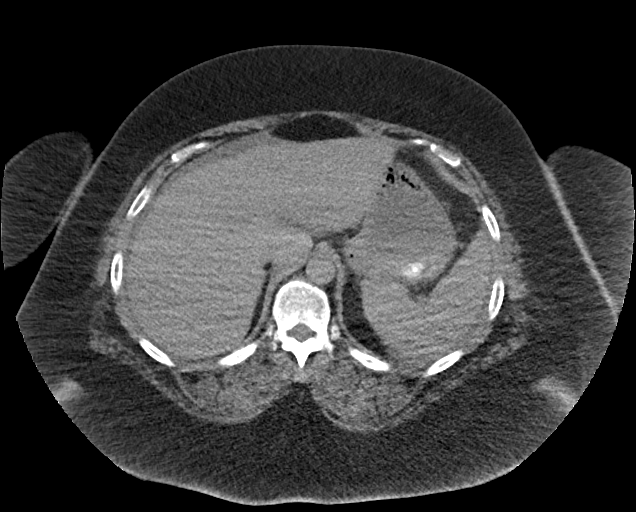
[im 73/94  bone]
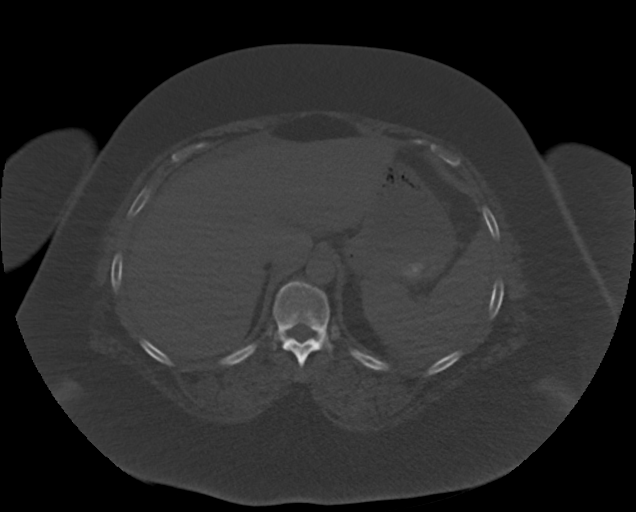
[im 79/94  soft-tissue]
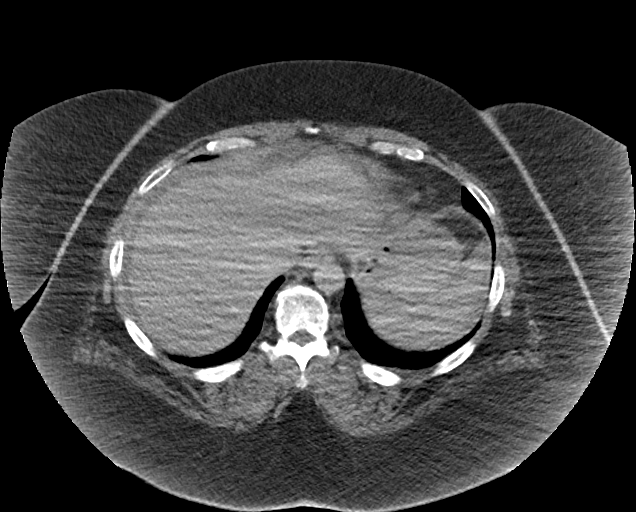
[im 88/94  soft-tissue]
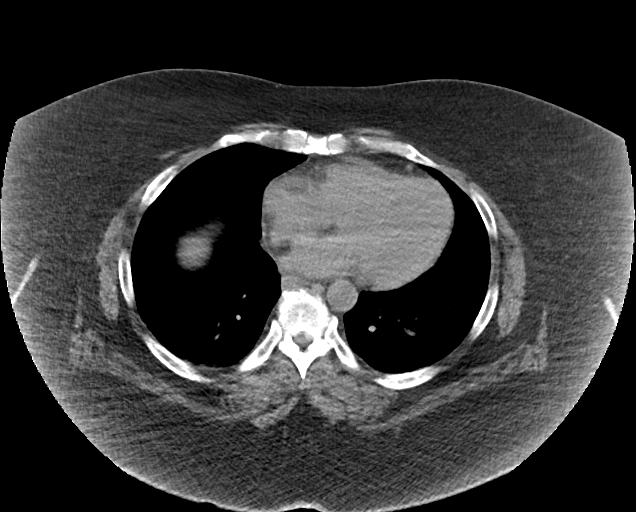

[Series 4: abdomen cor 5.00 br40 s3 · coronal · 0.96mm/px · 3 of 76 slices shown]
[im 26/76  soft-tissue]
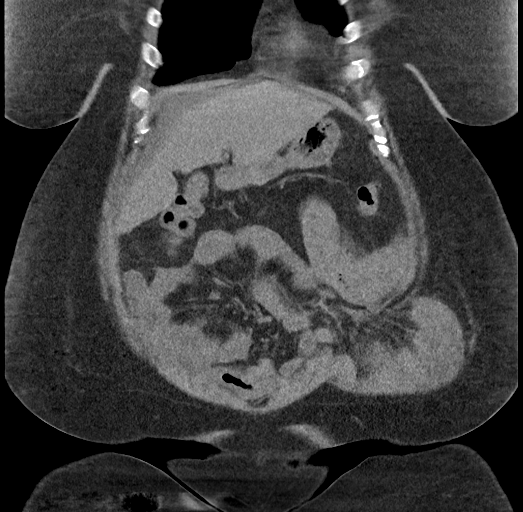
[im 34/76  soft-tissue]
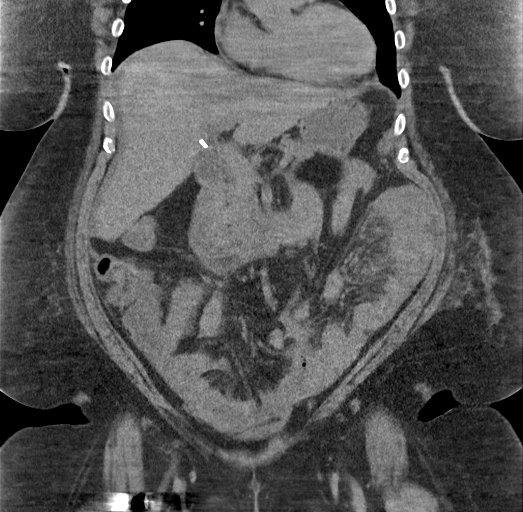
[im 42/76  soft-tissue]
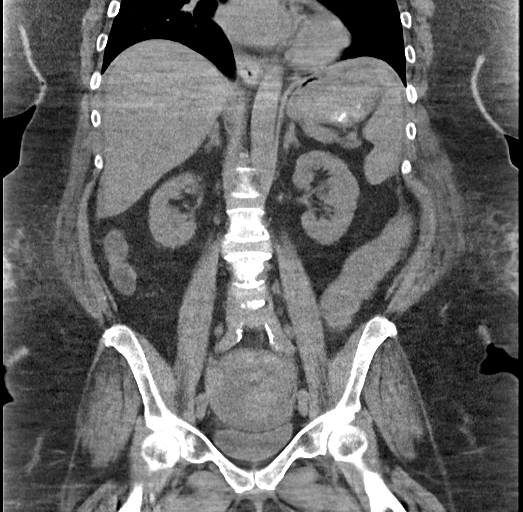

[14 of 46 positions shown; findings below may reference images not displayed]

FINDINGS: The lung bases are clear. Surgical changes are seen reflecting cholecystectomy. The noncontrast
visualized portions of the liver, spleen, pancreas, and adrenals are within normal limits. The right
kidney
shows no hydronephrosis, perinephric fat stranding, or obstructing renal calculi. The left kidney
shows no
hydronephrosis, perinephric fat stranding, or obstructing renal calculi. The aorta is normal in
course and
caliber. Moderate bowel wall thickening is seen of the jejunum in the left upper abdomen. There is a
large
left ventral abdominal wall peristomal hernia with the mouth measuring 4.7 cm in size which appears
to
contain incarcerated small bowel loops. There is also bowel wall thickening of the loops of bowel
within the
incarcerated hernia. No pathologically dilated loops of large or small bowel are seen. The appendix
is not
definitively seen in the right lower quadrant. There is no free air seen in the abdomen. Small
amount of free
fluid is seen around the liver in the right upper abdomen as well as in the dependent pelvis. The
noncontrast
urinary bladder is unremarkable.
IMPRESSION: 1. There is no hydronephrosis, perinephric fat stranding, or obstructing renal calculi.
2. There is a large peristomal hernia seen in the left mid abdomen containing prominent small bowel
loops
with bowel wall thickening suggests incarceration with secondary obstruction. Follow-up recommended,
consider surgical consultation.
3. Small amount of free fluid is seen in the right upper abdomen around the liver as well as within
the pelvis
likely related to #2 above.

THE RESULTS OF THIS EXAM WERE DISCUSSED WITH MANASAN AT [id number]
08-28-2018 [facility]

Tech Notes:

Pt c/o abd pain x 3 days. AW

## 2019-05-18 NOTE — Death Pronouncement
Physician Death Harold Barban                                                          Physician Pronouncing Death: Lucile Crater, MD:   Pager     Date Death Pronounced:  May 03, 2019     Time Death Pronounced: 1822 pm    Service Attending Physician: Lorrin Mais    Service APRN: Orma Flaming, APRN    After identification of patient confirmed, death was pronounced on the basis of:   Patient unresponsive to verbal or tactile stimuli, No heart sounds heard or pulses palpated, No spontaneous breaths or chest movement and Pupils fixed and dilated, no pupillary light or corneal reflex    The following family or contacts were notified of patient's death:  3 sisters at bedside.    Comments: None    Autopsy:  Declined      REMINDER: Service Attending or Resident to sign  State Certificate of Death

## 2019-05-18 DEATH — deceased
